# Patient Record
Sex: Female | Born: 1995 | Race: Black or African American | Hispanic: No | Marital: Single | Smoking: Never smoker
Health system: Southern US, Community
[De-identification: ages and names within clinical notes are randomized; demographics above are authoritative.]

## PROBLEM LIST (undated history)

## (undated) DIAGNOSIS — O24419 Gestational diabetes mellitus in pregnancy, unspecified control: Secondary | ICD-10-CM

## (undated) DIAGNOSIS — A64 Unspecified sexually transmitted disease: Secondary | ICD-10-CM

## (undated) HISTORY — PX: NO PAST SURGERIES: SHX2092

## (undated) HISTORY — DX: Gestational diabetes mellitus in pregnancy, unspecified control: O24.419

## (undated) HISTORY — DX: Unspecified sexually transmitted disease: A64

---
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
## 2013-02-09 ENCOUNTER — Ambulatory Visit (INDEPENDENT_AMBULATORY_CARE_PROVIDER_SITE_OTHER): Payer: BC Managed Care – PPO | Admitting: Obstetrics and Gynecology

## 2013-02-09 ENCOUNTER — Encounter: Payer: Self-pay | Admitting: Obstetrics and Gynecology

## 2013-02-09 VITALS — BP 118/60 | HR 82 | Ht 63.0 in | Wt 147.0 lb

## 2013-02-09 DIAGNOSIS — N912 Amenorrhea, unspecified: Secondary | ICD-10-CM

## 2013-02-09 DIAGNOSIS — D500 Iron deficiency anemia secondary to blood loss (chronic): Secondary | ICD-10-CM

## 2013-02-09 DIAGNOSIS — N920 Excessive and frequent menstruation with regular cycle: Secondary | ICD-10-CM

## 2013-02-09 DIAGNOSIS — B9689 Other specified bacterial agents as the cause of diseases classified elsewhere: Secondary | ICD-10-CM

## 2013-02-09 DIAGNOSIS — N760 Acute vaginitis: Secondary | ICD-10-CM

## 2013-02-09 DIAGNOSIS — N921 Excessive and frequent menstruation with irregular cycle: Secondary | ICD-10-CM

## 2013-02-09 DIAGNOSIS — A499 Bacterial infection, unspecified: Secondary | ICD-10-CM

## 2013-02-09 MED ORDER — METRONIDAZOLE 0.75 % VA GEL: 1.0000 | Freq: Every day | VAGINAL | Status: DC

## 2013-02-09 NOTE — Patient Instructions (Addendum)
Ethinyl Estradiol; Etonogestrel vaginal ring What is this medicine? ETHINYL ESTRADIOL; ETONOGESTREL (ETH in il es tra DYE ole; et oh noe JES trel) vaginal ring is a flexible, vaginal ring used as a contraceptive (birth control method). This medicine combines two types of female hormones, an estrogen and a progestin. This ring is used to prevent ovulation and pregnancy. Each ring is effective for one month. This medicine may be used for other purposes; ask your health care provider or pharmacist if you have questions. What should I tell my health care provider before I take this medicine? They need to know if you have or ever had any of these conditions: -abnormal vaginal bleeding -blood vessel disease or blood clots -breast, cervical, endometrial, ovarian, liver, or uterine cancer -diabetes -gallbladder disease -heart disease or recent heart attack -high blood pressure -high cholesterol -kidney disease -liver disease -migraine headaches -stroke -systemic lupus erythematosus (SLE) -tobacco smoker -an unusual or allergic reaction to estrogens, progestins, other medicines, foods, dyes, or preservatives -pregnant or trying to get pregnant -breast-feeding How should I use this medicine? Insert the ring into your vagina as directed. Follow the directions on the prescription label. The ring will remain place for 3 weeks and is then removed for a 1-week break. A new ring is inserted 1 week after the last ring was removed, on the same day of the week. Do not use more often than directed. A patient package insert for the product will be given with each prescription and refill. Read this sheet carefully each time. The sheet may change frequently. Contact your pediatrician regarding the use of this medicine in children. Special care may be needed. This medicine has been used in female children who have started having menstrual periods. Overdosage: If you think you have taken too much of this medicine  contact a poison control center or emergency room at once. NOTE: This medicine is only for you. Do not share this medicine with others. What if I miss a dose? You will need to replace your vaginal ring once a month as directed. If the ring should slip out, or if you leave it in longer or shorter than you should, contact your health care professional for advice. What may interact with this medicine? -acetaminophen -antibiotics or medicines for infections, especially rifampin, rifabutin, rifapentine, and griseofulvin, and possibly penicillins or tetracyclines -aprepitant -ascorbic acid (vitamin C) -atorvastatin -barbiturate medicines, such as phenobarbital -bosentan -carbamazepine -caffeine -clofibrate -cyclosporine -dantrolene -doxercalciferol -felbamate -grapefruit juice -hydrocortisone -medicines for anxiety or sleeping problems, such as diazepam or temazepam -medicines for diabetes, including pioglitazone -modafinil -mycophenolate -nefazodone -oxcarbazepine -phenytoin -prednisolone -ritonavir or other medicines for HIV infection or AIDS -rosuvastatin -selegiline -soy isoflavones supplements -St. John's wort -tamoxifen or raloxifene -theophylline -thyroid hormones -topiramate -warfarin This list may not describe all possible interactions. Give your health care provider a list of all the medicines, herbs, non-prescription drugs, or dietary supplements you use. Also tell them if you smoke, drink alcohol, or use illegal drugs. Some items may interact with your medicine. What should I watch for while using this medicine? Visit your doctor or health care professional for regular checks on your progress. You will need a regular breast and pelvic exam and Pap smear while on this medicine. Use an additional method of contraception during the first cycle that you use this ring. If you have any reason to think you are pregnant, stop using this medicine right away and contact your  doctor or health care professional. If you are using this   medicine for hormone related problems, it may take several cycles of use to see improvement in your condition. Smoking increases the risk of getting a blood clot or having a stroke while you are using hormonal birth control, especially if you are more than 19 years old. You are strongly advised not to smoke. This medicine can make your body retain fluid, making your fingers, hands, or ankles swell. Your blood pressure can go up. Contact your doctor or health care professional if you feel you are retaining fluid. This medicine can make you more sensitive to the sun. Keep out of the sun. If you cannot avoid being in the sun, wear protective clothing and use sunscreen. Do not use sun lamps or tanning beds/booths. If you wear contact lenses and notice visual changes, or if the lenses begin to feel uncomfortable, consult your eye care specialist. In some women, tenderness, swelling, or minor bleeding of the gums may occur. Notify your dentist if this happens. Brushing and flossing your teeth regularly may help limit this. See your dentist regularly and inform your dentist of the medicines you are taking. If you are going to have elective surgery, you may need to stop using this medicine before the surgery. Consult your health care professional for advice. This medicine does not protect you against HIV infection (AIDS) or any other sexually transmitted diseases. What side effects may I notice from receiving this medicine? Side effects that you should report to your doctor or health care professional as soon as possible: -breast tissue changes or discharge -changes in vaginal bleeding during your period or between your periods -chest pain -coughing up blood -dizziness or fainting spells -headaches or migraines -leg, arm or groin pain -severe or sudden headaches -stomach pain (severe) -sudden shortness of breath -sudden loss of coordination,  especially on one side of the body -speech problems -symptoms of vaginal infection like itching, irritation or unusual discharge -tenderness in the upper abdomen -vomiting -weakness or numbness in the arms or legs, especially on one side of the body -yellowing of the eyes or skin Side effects that usually do not require medical attention (report to your doctor or health care professional if they continue or are bothersome): -breakthrough bleeding and spotting that continues beyond the 3 initial cycles of pills -breast tenderness -mood changes, anxiety, depression, frustration, anger, or emotional outbursts -increased sensitivity to sun or ultraviolet light -nausea -skin rash, acne, or brown spots on the skin -weight gain (slight) This list may not describe all possible side effects. Call your doctor for medical advice about side effects. You may report side effects to FDA at 1-800-FDA-1088. Where should I keep my medicine? Keep out of the reach of children. Store at room temperature between 15 and 30 degrees C (59 and 86 degrees F) for up to 4 months. The product will expire after 4 months. Protect from light. Throw away any unused medicine after the expiration date. NOTE: This sheet is a summary. It may not cover all possible information. If you have questions about this medicine, talk to your doctor, pharmacist, or health care provider.  2013, [employer] Standard. (06/20/2008 12:03:58 PM)  Anemia, Frequently Asked Questions WHAT ARE THE SYMPTOMS OF ANEMIA?  Headache.  Difficulty thinking.  Fatigue.  Shortness of breath.  Weakness.  Rapid heartbeat. AT WHAT POINT ARE PEOPLE CONSIDERED ANEMIC?  This varies with gender and age.   Both hemoglobin (Hgb) and hematocrit values are used to define anemia. These lab values are obtained from a complete blood  count (CBC) test. This is performed at a caregiver's office.  The normal range of hemoglobin values for adult men is 14.0  g/dL to 11.9 g/dL. For nonpregnant women, values are 12.3 g/dL to 14.7 g/dL.  The World Health Organization defines anemia as less than 12 g/dL for nonpregnant women and less than 13 g/dL for men.  For adult males, the average normal hematocrit is 46%, and the range is 40% to 52%.  For adult females, the average normal hematocrit is 41%, and the range is 35% to 47%.  Values that fall below the lower limits can be a sign of anemia and should have further checking (evaluation). GROUPS OF PEOPLE WHO ARE AT RISK FOR DEVELOPING ANEMIA INCLUDE:   Infants who are breastfed or taking a formula that is not fortified with iron.  Children going through a rapid growth spurt. The iron available can not keep up with the needs for a red cell mass which must grow with the child.  Women in childbearing years. They need iron because of blood loss during menstruation.  Pregnant women. The growing fetus creates a high demand for iron.  People with ongoing gastrointestinal blood loss are at risk of developing iron deficiency.  Individuals with leukemia or cancer who must receive chemotherapy or radiation to treat their disease. The drugs or radiation used to treat these diseases often decreases the bone marrow's ability to make cells of all classes. This includes red blood cells, white blood cells, and platelets.  Individuals with chronic inflammatory conditions such as rheumatoid arthritis or chronic infections.  The elderly. ARE SOME TYPES OF ANEMIA INHERITED?   Yes, some types of anemia are due to inherited or genetic defects.  Sickle cell anemia. This occurs most often in people of African, African American, and Mediterranean descent.  Thalassemia (or Cooley's anemia). This type is found in people of Mediterranean and Southeast Asian descent. These types of anemia are common.  [place]. This is rare. CAN CERTAIN MEDICATIONS CAUSE A PERSON TO BECOME ANEMIC?  Yes. For example, drugs to fight cancer  (chemotherapeutic agents) often cause anemia. These drugs can slow the bone marrow's ability to make red blood cells. If there are not enough red blood cells, the body does not get enough oxygen. WHAT HEMATOCRIT LEVEL IS REQUIRED TO DONATE BLOOD?  The lower limit of an acceptable hematocrit for blood donors is 38%. If you have a low hematocrit value, you should schedule an appointment with your caregiver. ARE BLOOD TRANSFUSIONS COMMONLY USED TO CORRECT ANEMIA, AND ARE THEY DANGEROUS?  They are used to treat anemia as a last resort. Your caregiver will find the cause of the anemia and correct it if possible. Most blood transfusions are given because of excessive bleeding at the time of surgery, with trauma, or because of bone marrow suppression in patients with cancer or leukemia on chemotherapy. Blood transfusions are safer than ever before. We also know that blood transfusions affect the immune system and may increase certain risks. There is also a concern for human error. In 1/16,000 transfusions, a patient receives a transfusion of blood that is not matched with his or her blood type.  WHAT IS IRON DEFICIENCY ANEMIA AND CAN I CORRECT IT BY CHANGING MY DIET?  Iron is an essential part of hemoglobin. Without enough hemoglobin, anemia develops and the body does not get the right amount of oxygen. Iron deficiency anemia develops after the body has had a low level of iron for a long time. This is  either caused by blood loss, not taking in or absorbing enough iron, or increased demands for iron (like pregnancy or rapid growth).  Foods from animal origin such as beef, chicken, and pork, are good sources of iron. Be sure to have one of these foods at each meal. Vitamin C helps your body absorb iron. Foods rich in Vitamin C include citrus, bell pepper, strawberries, spinach and cantaloupe. In some cases, iron supplements may be needed in order to correct the iron deficiency. In the case of poor absorption, extra  iron may have to be given directly into the vein through a needle (intravenously). I HAVE BEEN DIAGNOSED WITH IRON DEFICIENCY ANEMIA AND MY CAREGIVER PRESCRIBED IRON SUPPLEMENTS. HOW LONG WILL IT TAKE FOR MY BLOOD TO BECOME NORMAL?  It depends on the degree of anemia at the beginning of treatment. Most people with mild to moderate iron deficiency, anemia will correct the anemia over a period of 2 to 3 months. But after the anemia is corrected, the iron stored by the body is still low. Caregivers often suggest an additional 6 months of oral iron therapy once the anemia has been reversed. This will help prevent the iron deficiency anemia from quickly happening again. Non-anemic adult males should take iron supplements only under the direction of a doctor, too much iron can cause liver damage.  MY HEMOGLOBIN IS 9 G/DL AND I AM SCHEDULED FOR SURGERY. SHOULD I POSTPONE THE SURGERY?  If you have Hgb of 9, you should discuss this with your caregiver right away. Many patients with similar hemoglobin levels have had surgery without problems. If minimal blood loss is expected for a minor procedure, no treatment may be necessary.  If a greater blood loss is expected for more extensive procedures, you should ask your caregiver about being treated with erythropoietin and iron. This is to accelerate the recovery of your hemoglobin to a normal level before surgery. An anemic patient who undergoes high-blood-loss surgery has a greater risk of surgical complications and need for a blood transfusion, which also carries some risk.  I HAVE BEEN TOLD THAT HEAVY MENSTRUAL PERIODS CAUSE ANEMIA. IS THERE ANYTHING I CAN DO TO PREVENT THE ANEMIA?  Anemia that results from heavy periods is usually due to iron deficiency. You can try to meet the increased demands for iron caused by the heavy monthly blood loss by increasing the intake of iron-rich foods. Iron supplements may be required. Discuss your concerns with your caregiver. WHAT  CAUSES ANEMIA DURING PREGNANCY?  Pregnancy places major demands on the body. The mother must meet the needs of both her body and her growing baby. The body needs enough iron and folate to make the right amount of red blood cells. To prevent anemia while pregnant, the mother should stay in close contact with her caregiver.  Be sure to eat a diet that has foods rich in iron and folate like liver and dark green leafy vegetables. Folate plays an important role in the normal development of a baby's spinal cord. Folate can help prevent serious disorders like spina bifida. If your diet does not provide adequate nutrients, you may want to talk with your caregiver about nutritional supplements.  WHAT IS THE RELATIONSHIP BETWEEN FIBROID TUMORS AND ANEMIA IN WOMEN?  The relationship is usually caused by the increased menstrual blood loss caused by fibroids. Good iron intake may be required to prevent iron deficiency anemia from developing.  Document Released: 02/11/2004 Document Revised: 09/27/2011 Document Reviewed: 07/28/2010 ExitCare Patient Information 2014 [place],  [place],  Please take a multivitamin with iron daily.  If you area already doing this, start irons sulfate 325 mg by mouth daily until we resolve your bleeding issues.   Conley Simmonds, MD

## 2013-02-09 NOTE — Progress Notes (Signed)
Patient ID: Sierra Wu, female   DOB: 01/15/1996, 17 y.o.   MRN: [id number]  17 y.o.   Single    African American   female   G0P0   here requesting removal of Implanon. Placed October 2012.    Having menses last for 3 -4 weeks at a time.  Can be heavy.  Not bleeding now.   Placed for pregnancy prevention and treatment of cramping.  Tired OCPs but could not remember to take them in the past (when she was 17 years old.)  Not interested in [place].  Worried about weight gain with contraception.    Just had STD testing. 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  States her partner is negative for HSV. Asking questions about prevention of infection for him.  Was treated for bacterial vaginosis in March.  Did not tolerate Flagyl due to the taste.   Has some heavy discharge, no burn, can have an odor.     Patient's last menstrual period was 12/12/2012.          Sexually active: yes  The current method of family planning is Implanon and condoms.      Hgb:  11.6        UPT:  Neg   History reviewed. No pertinent family history.  There are no active problems to display for this patient.   Past Medical History  Diagnosis Date  . STD (sexually transmitted disease) 08/2012    Dx'd with HSV II    History reviewed. No pertinent past surgical history.  Allergies: Review of patient's allergies indicates no known allergies.  Current Outpatient Prescriptions  Medication Sig Dispense Refill  . etonogestrel (IMPLANON) 68 MG IMPL implant Inject 1 each into the skin once.       No current facility-administered medications for this visit.    ROS: Pertinent items are noted in HPI.  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  Exam:    BP 118/60  Pulse 82  Ht 5\' 3"  (1.6 m)  Wt 147 lb (66.679 kg)  BMI 26.05 kg/m2  LMP 12/12/2012   Wt Readings from Last 3 Encounters:  02/09/13 147 lb (66.679 kg) (78%*, Z = 0.77)   * Growth percentiles are based on CDC  2-20 Years data.     Ht Readings from Last 3 Encounters:  02/09/13 5\' 3"  (1.6 m) (31%*, Z = -0.51)   * Growth percentiles are based on CDC 2-20 Years data.    General appearance: alert, cooperative and appears stated age Left arm - Implanon rod palpable by patient and clinician.  Pelvic: External genitalia:  no lesions              Urethra:  normal appearing urethra with no masses, tenderness or lesions              Bartholins and Skenes: normal                 Vagina: normal appearing vagina with normal color and discharge, no lesions              Cervix: normal appearance                     Bimanual Exam:  Uterus:  uterus is normal size, shape, consistency and nontender  Adnexa: normal adnexa in size, nontender and no masses                                         Wet prep - pH 5.5, positive for clue cells and whiff test.  Negative for trichomonas and yeast.  Assessment  Menometrorrhagia with Implanon. History of dysmenorrhea. UPT negative. Mild anemia. Bacterial vaginosis. History of HSV II.  Plan  I discussed with the etiology of the bleeding. We discussed how a course of estrogen therapy may correct the bleeding profile. We discussed alternative to Implanon such as OCPs, Ortho Evra, Mirena IUD, Depo Provera.  Patient is most interested in Nuva Ring at this time but wants to talk to her mother about it. Start a multivitamin with iron or add FeSO4 325 mg to daily regimen if already on a multivitamin. Metrogel pv at hs for 5 nights.  See Epic. We discussed female condom use for reduced risk of transmission of HSV to her partner.   An After Visit Summary was printed and given to the patient.

## 2013-10-31 ENCOUNTER — Encounter: Payer: Self-pay | Admitting: Obstetrics and Gynecology

## 2013-12-03 ENCOUNTER — Encounter: Payer: Self-pay | Admitting: Obstetrics and Gynecology

## 2017-04-25 DIAGNOSIS — N926 Irregular menstruation, unspecified: Secondary | ICD-10-CM | POA: Insufficient documentation

## 2017-08-03 ENCOUNTER — Other Ambulatory Visit: Payer: Self-pay

## 2017-08-03 ENCOUNTER — Ambulatory Visit (HOSPITAL_COMMUNITY)
Admission: EM | Admit: 2017-08-03 | Discharge: 2017-08-03 | Disposition: A | Discharge status: Home / Self Care | Payer: Managed Care, Other (non HMO) | Attending: Family Medicine | Admitting: Family Medicine

## 2017-08-03 ENCOUNTER — Encounter (HOSPITAL_COMMUNITY): Payer: Self-pay | Admitting: Emergency Medicine

## 2017-08-03 DIAGNOSIS — R110 Nausea: Secondary | ICD-10-CM

## 2017-08-03 DIAGNOSIS — Z3202 Encounter for pregnancy test, result negative: Secondary | ICD-10-CM

## 2017-08-03 DIAGNOSIS — R109 Unspecified abdominal pain: Secondary | ICD-10-CM

## 2017-08-03 DIAGNOSIS — K2900 Acute gastritis without bleeding: Secondary | ICD-10-CM | POA: Diagnosis not present

## 2017-08-03 LAB — POCT URINALYSIS DIP (DEVICE)
BILIRUBIN URINE: NEGATIVE
GLUCOSE, UA: NEGATIVE mg/dL
Hgb urine dipstick: NEGATIVE
KETONES UR: 15 mg/dL — AB
LEUKOCYTES UA: NEGATIVE
Nitrite: NEGATIVE
PH: 6 (ref 5.0–8.0)
Protein, ur: NEGATIVE mg/dL
Specific Gravity, Urine: >=1.03 (ref 1.005–1.030)
Urobilinogen, UA: 1 mg/dL (ref 0.0–1.0)

## 2017-08-03 LAB — POCT I-STAT, CHEM 8
BUN: 7 mg/dL (ref 6–20)
CALCIUM ION: 1.21 mmol/L (ref 1.15–1.40)
CHLORIDE: 106 mmol/L (ref 101–111)
Creatinine, Ser: 0.7 mg/dL (ref 0.44–1.00)
Glucose, Bld: 92 mg/dL (ref 65–99)
HEMATOCRIT: 38 % (ref 36.0–46.0)
Hemoglobin: 12.9 g/dL (ref 12.0–15.0)
Potassium: 3.8 mmol/L (ref 3.5–5.1)
SODIUM: 142 mmol/L (ref 135–145)
TCO2: 24 mmol/L (ref 22–32)

## 2017-08-03 LAB — POCT PREGNANCY, URINE: Preg Test, Ur: NEGATIVE

## 2017-08-03 MED ORDER — ONDANSETRON 8 MG PO TBDP: 8.0000 mg | ORAL_TABLET | Freq: Three times a day (TID) | ORAL | 0 refills | Status: DC | PRN

## 2017-08-03 MED ORDER — OMEPRAZOLE 20 MG PO CPDR: 20.0000 mg | DELAYED_RELEASE_CAPSULE | Freq: Every day | ORAL | 1 refills | Status: DC

## 2017-08-03 NOTE — ED Provider Notes (Signed)
[facility]   [id number] 08/03/17 Arrival Time: 1338   SUBJECTIVE:  Sierra Wu is a 22 y.o. female who presents to the urgent care with complaint of generalized sharp abdominal pain with nausea that started Monday.  Today she reports two hard black BM's and 1 episode of vomiting today that was yellow.  She took Pepto Bismol yesterday but it did not help  She's experiencing menstrual cramps.  Patient works at [employer]  Past Medical History:  Diagnosis Date  . STD (sexually transmitted disease) 08/2012   Dx'd with HSV II   History reviewed. No pertinent family history. Social History   Socioeconomic History  . Marital status: Single    Spouse name: Not on file  . Number of children: Not on file  . Years of education: Not on file  . Highest education level: Not on file  Social Needs  . Financial resource strain: Not on file  . Food insecurity - worry: Not on file  . Food insecurity - inability: Not on file  . Transportation needs - medical: Not on file  . Transportation needs - non-medical: Not on file  Occupational History  . Not on file  Tobacco Use  . Smoking status: Never Smoker  . Smokeless tobacco: Never Used  Substance and Sexual Activity  . Alcohol use: No  . Drug use: No  . Sexual activity: Yes    Birth control/protection: Other-see comments    Comment: Implanon inserted 04-23-2011  Other Topics Concern  . Not on file  Social History Narrative  . Not on file   Current Meds  Medication Sig  . norethindrone-ethinyl estradiol (JUNEL 1/20) 1-20 MG-MCG tablet Take 1 tablet by mouth daily.   No Known Allergies    ROS: As per HPI, remainder of ROS negative.   OBJECTIVE:   Vitals:   08/03/17 1418  BP: 110/75  Pulse: 80  Temp: 98.3 F (36.8 C)  TempSrc: Oral  SpO2: 96%     General appearance: alert; no distress Eyes: PERRL; EOMI; conjunctiva normal HENT: normocephalic; atraumatic; oral mucosa normal Neck: supple Lungs: clear to  auscultation bilaterally Heart: regular rate and rhythm Abdomen: soft, tender with deep palpation to epigastrium and periumbilical areas;  nontender right abdomen; bowel sounds normal; no masses or organomegaly; no guarding or rebound tenderness Back: no CVA tenderness Extremities: no cyanosis or edema; symmetrical with no gross deformities Skin: warm and dry Neurologic: normal gait; grossly normal Psychological: alert and cooperative; normal mood and affect      Labs:  Results for orders placed or performed during the hospital encounter of 08/03/17  POCT urinalysis dip (device)  Result Value Ref Range   Glucose, UA NEGATIVE NEGATIVE mg/dL   Bilirubin Urine NEGATIVE NEGATIVE   Ketones, ur 15 (A) NEGATIVE mg/dL   Specific Gravity, Urine >=1.030 1.005 - 1.030   Hgb urine dipstick NEGATIVE NEGATIVE   pH 6.0 5.0 - 8.0   Protein, ur NEGATIVE NEGATIVE mg/dL   Urobilinogen, UA 1.0 0.0 - 1.0 mg/dL   Nitrite NEGATIVE NEGATIVE   Leukocytes, UA NEGATIVE NEGATIVE  I-STAT, chem 8  Result Value Ref Range   Sodium 142 135 - 145 mmol/L   Potassium 3.8 3.5 - 5.1 mmol/L   Chloride 106 101 - 111 mmol/L   BUN 7 6 - 20 mg/dL   Creatinine, Ser 1.190.70 0.44 - 1.00 mg/dL   Glucose, Bld 92 65 - 99 mg/dL   Calcium, Ion 1.471.21 8.291.15 - 1.40 mmol/L   TCO2 24 22 -  32 mmol/L   Hemoglobin 12.9 12.0 - 15.0 g/dL   HCT 40.9 81.1 - 91.4 %  Pregnancy, urine POC  Result Value Ref Range   Preg Test, Ur NEGATIVE NEGATIVE    Labs Reviewed  POCT URINALYSIS DIP (DEVICE) - Abnormal; Notable for the following components:      Result Value   Ketones, ur 15 (*)    All other components within normal limits  POCT I-STAT, CHEM 8  POCT PREGNANCY, URINE    No results found.     ASSESSMENT & PLAN:  1. Acute superficial gastritis without hemorrhage     Meds ordered this encounter  Medications  . omeprazole (PRILOSEC) 20 MG capsule    Sig: Take 1 capsule (20 mg total) by mouth daily.    Dispense:  14 capsule      Refill:  1  . ondansetron (ZOFRAN-ODT) 8 MG disintegrating tablet    Sig: Take 1 tablet (8 mg total) by mouth every 8 (eight) hours as needed for nausea.    Dispense:  12 tablet    Refill:  0    Reviewed expectations re: course of current medical issues. Questions answered. Outlined signs and symptoms indicating need for more acute intervention. Patient verbalized understanding. After Visit Summary given.     Elvina Sidle, MD 08/03/17 1506

## 2017-08-03 NOTE — Discharge Instructions (Signed)
Expect symptoms to improve in 24-48 hours or follow up with your primary care provider or this clinic

## 2017-08-03 NOTE — ED Triage Notes (Signed)
Pt reports generalized sharp abdominal pain with nausea that started yesterday.  Today she reports two hard black BM's and 1 episode of vomiting today that was yello.

## 2018-03-07 ENCOUNTER — Encounter: Payer: Managed Care, Other (non HMO) | Admitting: Family Medicine

## 2018-04-05 ENCOUNTER — Encounter (HOSPITAL_COMMUNITY): Payer: Self-pay | Admitting: Emergency Medicine

## 2018-04-05 ENCOUNTER — Ambulatory Visit (HOSPITAL_COMMUNITY)
Admission: EM | Admit: 2018-04-05 | Discharge: 2018-04-05 | Disposition: A | Discharge status: Home / Self Care | Payer: 59 | Attending: Family Medicine | Admitting: Family Medicine

## 2018-04-05 DIAGNOSIS — J020 Streptococcal pharyngitis: Secondary | ICD-10-CM | POA: Diagnosis not present

## 2018-04-05 LAB — POCT RAPID STREP A: Streptococcus, Group A Screen (Direct): POSITIVE — AB

## 2018-04-05 MED ORDER — AMOXICILLIN 500 MG PO CAPS: 500.0000 mg | ORAL_CAPSULE | Freq: Two times a day (BID) | ORAL | 0 refills | Status: AC

## 2018-04-05 MED ORDER — LIDOCAINE VISCOUS HCL 2 % MT SOLN: OROMUCOSAL | 0 refills | Status: DC

## 2018-04-05 NOTE — ED Triage Notes (Signed)
Pt states she has a lump on one of her tonsils and it makes it hard to swallow, with some ear pain. Pt states "a lot is going on in my head, it feels heavy".

## 2018-04-05 NOTE — Discharge Instructions (Signed)
Rapid strep positive. Start amoxicillin as directed. Start lidocaine for sore throat, do not eat or drink for the next 40 mins after use as it can stunt your gag reflex.Tylenol/Motrin for fever and pain. Monitor for any worsening of symptoms, trouble breathing, trouble swallowing, swelling of the throat, leaning forward to breath, drooling, follow up here or at the emergency department for reevaluation.  For sore throat/cough try using a honey-based tea. Use 3 teaspoons of honey with juice squeezed from half lemon. Place shaved pieces of ginger into 1/2-1 cup of water and warm over stove top. Then mix the ingredients and repeat every 4 hours as needed.

## 2018-04-05 NOTE — ED Provider Notes (Signed)
[facility]    CSN: [id number] Arrival date & time: 04/05/18  1235     History   Chief Complaint Chief Complaint  Patient presents with  . Sore Throat    HPI Sierra Wu is a 22 y.o. female.   22 year old female comes in for 2-day history of URI symptoms.  Has had mild intermittent cough, nasal congestion without obvious rhinorrhea.  Denies fever, chills, night sweats.  Noticed swollen tonsils with painful swallowing.  Has tried to avoid solids the past few days due to the pain.  No sick contact.  Current every other day smoker, THC use. Has not taken anything for the symptoms.      Past Medical History:  Diagnosis Date  . 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   History reviewed. No pertinent surgical history.  OB History    Gravida  0   Para      Term      Preterm      AB      Living        SAB      TAB      Ectopic      Multiple      Live Births               Home Medications    Prior to Admission medications   Medication Sig Start Date End Date Taking? Authorizing Provider  amoxicillin (AMOXIL) 500 MG capsule Take 1 capsule (500 mg total) by mouth 2 (two) times daily for 10 days. 04/05/18 04/15/18  Belinda Fisher, PA-C  lidocaine (XYLOCAINE) 2 % solution 5-15 mL gurgle as needed 04/05/18   Cathie Hoops, Amy V, PA-C  norethindrone-ethinyl estradiol (JUNEL 1/20) 1-20 MG-MCG tablet Take 1 tablet by mouth daily.    [provider]  omeprazole (PRILOSEC) 20 MG capsule Take 1 capsule (20 mg total) by mouth daily. Patient not taking: Reported on 04/05/2018 08/03/17   Elvina Sidle, MD  ondansetron (ZOFRAN-ODT) 8 MG disintegrating tablet Take 1 tablet (8 mg total) by mouth every 8 (eight) hours as needed for nausea. Patient not taking: Reported on 04/05/2018 08/03/17   Elvina Sidle, MD    Family History Family History  Problem Relation Age of Onset    . Healthy Other     Social History Social History   Tobacco Use  . Smoking status: Never Smoker  . Smokeless tobacco: Never Used  Substance Use Topics  . Alcohol use: No  . Drug use: No     Allergies   Patient has no known allergies.   Review of Systems Review of Systems  Reason unable to perform ROS: See HPI as above.     Physical Exam Triage Vital Signs ED Triage Vitals  Enc Vitals Group     BP 04/05/18 1254 124/74     Pulse Rate 04/05/18 1254 87     Resp 04/05/18 1254 16     Temp 04/05/18 1254 98.5 F (36.9 C)     Temp src --      SpO2 04/05/18 1254 100 %     Weight --      Height --      Head Circumference --      Peak Flow --      Pain Score 04/05/18 1255 7     Pain Loc --      Pain Edu? --  Excl. in GC? --    No data found.  Updated Vital Signs BP 124/74   Pulse 87   Temp 98.5 F (36.9 C)   Resp 16   LMP 03/09/2018   SpO2 100%   Physical Exam  Constitutional: She is oriented to person, place, and time. She appears well-developed and well-nourished.  Non-toxic appearance. She does not appear ill. No distress.  HENT:  Head: Normocephalic and atraumatic.  Right Ear: Tympanic membrane, external ear and ear canal normal. Tympanic membrane is not erythematous and not bulging.  Left Ear: Tympanic membrane, external ear and ear canal normal. Tympanic membrane is not erythematous and not bulging.  Nose: Right sinus exhibits maxillary sinus tenderness and frontal sinus tenderness. Left sinus exhibits maxillary sinus tenderness and frontal sinus tenderness.  Mouth/Throat: Uvula is midline and mucous membranes are normal. Posterior oropharyngeal erythema present. Tonsils are 2+ on the right. Tonsils are 2+ on the left. No tonsillar exudate.  Eyes: Pupils are equal, round, and reactive to light. Conjunctivae are normal.  Neck: Normal range of motion. Neck supple.  Cardiovascular: Normal rate, regular rhythm and normal heart sounds. Exam reveals no  gallop and no friction rub.  No murmur heard. Pulmonary/Chest: Effort normal and breath sounds normal. She has no decreased breath sounds. She has no wheezes. She has no rhonchi. She has no rales.  Lymphadenopathy:    She has no cervical adenopathy.  Neurological: She is alert and oriented to person, place, and time.  Skin: Skin is warm and dry.  Psychiatric: She has a normal mood and affect. Her behavior is normal. Judgment normal.     UC Treatments / Results  Labs (all labs ordered are listed, but only abnormal results are displayed) Labs Reviewed  POCT RAPID STREP A - Abnormal; Notable for the following components:      Result Value   Streptococcus, Group A Screen (Direct) POSITIVE (*)    All other components within normal limits    EKG None  Radiology No results found.  Procedures Procedures (including critical care time)  Medications Ordered in UC Medications - No data to display  Initial Impression / Assessment and Plan / UC Course  I have reviewed the triage vital signs and the nursing notes.  Pertinent labs & imaging results that were available during my care of the patient were reviewed by me and considered in my medical decision making (see chart for details).    Rapid strep positive. Start antibiotic as directed. Symptomatic treatment as needed. Return precautions given.   Final Clinical Impressions(s) / UC Diagnoses   Final diagnoses:  Strep pharyngitis    ED Prescriptions    Medication Sig Dispense Auth. Provider   amoxicillin (AMOXIL) 500 MG capsule Take 1 capsule (500 mg total) by mouth 2 (two) times daily for 10 days. 20 capsule Yu, Amy V, PA-C   lidocaine (XYLOCAINE) 2 % solution 5-15 mL gurgle as needed 150 mL Threasa AlphaYu, Amy V, PA-C        Yu, Amy V, [place] 04/05/18 1439

## 2018-04-21 ENCOUNTER — Other Ambulatory Visit: Payer: Self-pay

## 2018-04-21 ENCOUNTER — Encounter (HOSPITAL_COMMUNITY): Payer: Self-pay | Admitting: Emergency Medicine

## 2018-04-21 ENCOUNTER — Ambulatory Visit (HOSPITAL_COMMUNITY)
Admission: EM | Admit: 2018-04-21 | Discharge: 2018-04-21 | Disposition: A | Discharge status: Home / Self Care | Payer: 59 | Attending: Family Medicine | Admitting: Family Medicine

## 2018-04-21 DIAGNOSIS — J029 Acute pharyngitis, unspecified: Secondary | ICD-10-CM | POA: Diagnosis not present

## 2018-04-21 DIAGNOSIS — N760 Acute vaginitis: Secondary | ICD-10-CM

## 2018-04-21 MED ORDER — PENICILLIN G BENZATHINE 1200000 UNIT/2ML IM SUSP
INTRAMUSCULAR | Status: AC
  Filled 2018-04-21: qty 2

## 2018-04-21 MED ORDER — PENICILLIN G BENZATHINE 1200000 UNIT/2ML IM SUSP
1.2000 10*6.[IU] | Freq: Once | INTRAMUSCULAR | Status: AC
  Administered 2018-04-21: 1.2 10*6.[IU] via INTRAMUSCULAR

## 2018-04-21 MED ORDER — FLUCONAZOLE 150 MG PO TABS: 150.0000 mg | ORAL_TABLET | Freq: Every day | ORAL | 0 refills | Status: DC

## 2018-04-21 NOTE — Discharge Instructions (Signed)
We will treat you for strep with a penicillin injection in clinic We are also prescribing Diflucan for yeast infection.  Take 1 tab today and 1 in 3 days if you are still having symptoms You may also benefit from some Zyrtec daily Follow up as needed for continued or worsening symptoms

## 2018-04-21 NOTE — ED Notes (Signed)
Bed: UC01 Expected date:  Expected time:  Means of arrival:  Comments: 

## 2018-04-21 NOTE — ED Provider Notes (Signed)
[facility]    CSN: [id number] Arrival date & time: 04/21/18  1135     History   Chief Complaint Chief Complaint  Patient presents with  . Sore Throat    Appt    HPI Sierra Wu is a 22 y.o. female.   Is a 22 year old female that presents with sore throat x2 days.  Symptoms have been constant and worsening overnight.  She was seen here on 04/05/2018 and diagnosed with strep throat and treated.  Reports she only took about half of the medication, went out of town for a couple of days and then came back and finished the rest of the antibiotics.  She is concerned that the strep did not get fully treated.  She did have relief of symptoms for about a week.  She also is suffering from a yeast infection from taking the antibiotics.  She is having labia swelling with erythema and vaginal itching.  She denies any abdominal pain, pelvic pain, back pain.  Denies any dysuria, hematuria.  she denies any associated cough, congestion, rhinorrhea, ear pain, fever, chills, body aches.  She is afebrile today but was afebrile previously when diagnosed with strep.  ROS per HPI      Past Medical History:  Diagnosis Date  . 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   History reviewed. No pertinent surgical history.  OB History    Gravida  0   Para      Term      Preterm      AB      Living        SAB      TAB      Ectopic      Multiple      Live Births               Home Medications    Prior to Admission medications   Medication Sig Start Date End Date Taking? Authorizing Provider  lidocaine (XYLOCAINE) 2 % solution 5-15 mL gurgle as needed 04/05/18  Yes Yu, Amy V, PA-C  norethindrone-ethinyl estradiol (JUNEL 1/20) 1-20 MG-MCG tablet Take 1 tablet by mouth daily.    [provider]  omeprazole (PRILOSEC) 20 MG capsule Take 1 capsule (20 mg total) by mouth  daily. Patient not taking: Reported on 04/05/2018 08/03/17   Elvina Sidle, MD  ondansetron (ZOFRAN-ODT) 8 MG disintegrating tablet Take 1 tablet (8 mg total) by mouth every 8 (eight) hours as needed for nausea. Patient not taking: Reported on 04/05/2018 08/03/17   Elvina Sidle, MD    Family History Family History  Problem Relation Age of Onset  . Healthy Other     Social History Social History   Tobacco Use  . Smoking status: Never Smoker  . Smokeless tobacco: Never Used  Substance Use Topics  . Alcohol use: No  . Drug use: No     Allergies   Patient has no known allergies.   Review of Systems Review of Systems   Physical Exam Triage Vital Signs ED Triage Vitals  Enc Vitals Group     BP 04/21/18 1152 118/76     Pulse Rate 04/21/18 1152 96     Resp --      Temp 04/21/18 1152 98.2 F (36.8 C)     Temp Source 04/21/18 1152 Oral     SpO2 --  Weight --      Height --      Head Circumference --      Peak Flow --      Pain Score 04/21/18 1151 7     Pain Loc --      Pain Edu? --      Excl. in GC? --    No data found.  Updated Vital Signs BP 118/76 (BP Location: Right Arm)   Pulse 96   Temp 98.2 F (36.8 C) (Oral)   LMP 04/08/2018 (Exact Date)   Visual Acuity Right Eye Distance:   Left Eye Distance:   Bilateral Distance:    Right Eye Near:   Left Eye Near:    Bilateral Near:     Physical Exam  Constitutional: She appears well-developed and well-nourished.  Very pleasant. Non toxic or ill appearing.     HENT:  Head: Normocephalic and atraumatic.  Mouth/Throat: Mucous membranes are normal. Tonsils are 2+ on the right. Tonsils are 2+ on the left. No tonsillar exudate.  Bilateral TMs normal.  External ears normal.  Mild posterior oropharyngeal erythema with 2+ tonsillar swelling.  No lymphadenopathy  Neck: Normal range of motion.  Cardiovascular: Normal rate and regular rhythm.  Pulmonary/Chest: Effort normal and breath sounds normal.   Lungs clear in all fields. No dyspnea or distress. No retractions or nasal flaring.   Abdominal: Soft. Bowel sounds are normal.  Neurological: She is alert.  Skin: Skin is warm and dry.  Psychiatric: She has a normal mood and affect.  Nursing note and vitals reviewed.    UC Treatments / Results  Labs (all labs ordered are listed, but only abnormal results are displayed) Labs Reviewed - No data to display  EKG None  Radiology No results found.  Procedures Procedures (including critical care time)  Medications Ordered in UC Medications  penicillin g benzathine (BICILLIN LA) 1200000 UNIT/2ML injection 1.2 Million Units (1.2 Million Units Intramuscular Given 04/21/18 1209)    Initial Impression / Assessment and Plan / UC Course  I have reviewed the triage vital signs and the nursing notes.  Pertinent labs & imaging results that were available during my care of the patient were reviewed by me and considered in my medical decision making (see chart for details).     Will go ahead and retreat strep with PCN here in the clinic.  Diflucan for yeast infection.  Follow up as needed for continued or worsening symptoms  Final Clinical Impressions(s) / UC Diagnoses   Final diagnoses:  Pharyngitis, unspecified etiology  Vaginitis and vulvovaginitis     Discharge Instructions     We will treat you for strep with a penicillin injection in clinic We are also prescribing Diflucan for yeast infection.  Take 1 tab today and 1 in 3 days if you are still having symptoms You may also benefit from some Zyrtec daily Follow up as needed for continued or worsening symptoms     ED Prescriptions    None     Controlled Substance Prescriptions  Controlled Substance Registry consulted? no   Janace Aris, NP 04/21/18 1237

## 2018-04-21 NOTE — ED Triage Notes (Signed)
Pt was seen and tested positive for Strep Throat on 04/05/18.  Pt admits that her last two doses were taken 4 days after they were supposed to be taken.  She states she got better but noticed last night that her throat was beginning to bother her again and this morning she reports a lot of pain with swallowing.  Pt also reports vaginal d/c and itching that started the day after taking her last dose of antibiotic.

## 2018-07-19 NOTE — L&D Delivery Note (Signed)
Delivery Note:  G1P0 at [redacted]w[redacted]d  Admitting diagnosis: Water Broke  Risks: None Augmentation: Pitocin ROM: SROM, 05/08/2019 @ 1130  Complete dilation at 05/09/2019  @ 1927 Onset of pushing at 1945 FHR second stage Cat 2 - Occ variables with ctx  Analgesia /Anesthesia intrapartum:Epidural  Pushing in lithotomy position RN, CNM, FOB present for birth and supportive  Delivery of a Live born female  Birth Weight: Pending APGAR: 6, 9  Newborn Delivery   Birth date/time: 05/09/2019 20:55:00 Delivery type: Vaginal, Spontaneous      in vertex presentation, position ROA to ROT.  Nuchal Cord: Yes x1 - Cord reduced by SNM during delivery. Cord double clamped after cessation of pulsation, cut by FOB.  Collection of cord blood for typing completed. Cord blood donation-Hyperspiraled  Arterial cord blood sample-Yes    Placenta delivered-Spontaneous  with 3 vessels . Uterotonics: IV Pitocin following delivery of placenta Placenta to L&D to be discarded. Uterine tone firm, bleeding light.  2nd degree  laceration identified. Repaired with 2-0 Vicryl in standard fashion. Episiotomy:None  Local analgesia: N/A - Epidural in place  Est. Blood Loss (mL): 665 mL Complications: None  APGAR:1 min-6 , 5 min-9  Mom to postpartum.  Baby to Couplet care / Skin to Skin.  Delivery Report:   Review the Delivery Report for details.     Signed: Juanna Cao, SNM, BSN 05/09/2019, 10:27 PM

## 2018-10-24 ENCOUNTER — Ambulatory Visit (INDEPENDENT_AMBULATORY_CARE_PROVIDER_SITE_OTHER): Payer: 59

## 2018-10-24 ENCOUNTER — Other Ambulatory Visit: Payer: Self-pay

## 2018-10-24 DIAGNOSIS — O0990 Supervision of high risk pregnancy, unspecified, unspecified trimester: Secondary | ICD-10-CM | POA: Insufficient documentation

## 2018-10-24 DIAGNOSIS — Z3490 Encounter for supervision of normal pregnancy, unspecified, unspecified trimester: Secondary | ICD-10-CM

## 2018-10-24 NOTE — Progress Notes (Signed)
I connected with  Sierra Wu on 10/24/18 at  8:15 AM EDT by telephone and verified that I am speaking with the correct person using two identifiers.     I discussed the limitations, risks, security and privacy concerns of performing an evaluation and management service by telephone and the availability of in person appointments. I also discussed with the patient that there may be a patient responsible charge related to this service. The patient expressed understanding and agreed to proceed.  Pt denies any bleeding or pain.  Pt reports LMP 08/13/18 with EDD 05/20/19.  Pt is 10w 2d today.  History obtained.  Informed pt that she will receive a pap smear and should think about genetic screening for her provider visit scheduled for 11/07/18.  Pt signed up for babyscripts and is successfully able to place BP value in app.  Pt has access to BP cuff at work however does not go weekly so I informed pt that we will give her a BP cuff and explain how to use it at her provider visit.  Pt verbalized understanding and with no further questions.  Ralene Bathe, RN 10/24/2018  8:37 AM

## 2018-10-24 NOTE — Progress Notes (Signed)
I have reviewed the chart and agree with nursing staff's documentation of this patient's encounter.  Vonzella Nipple, PA-C 10/24/2018 2:52 PM

## 2018-10-24 NOTE — Progress Notes (Signed)
.  cwht

## 2018-11-07 ENCOUNTER — Telehealth: Payer: Self-pay

## 2018-11-07 ENCOUNTER — Ambulatory Visit (INDEPENDENT_AMBULATORY_CARE_PROVIDER_SITE_OTHER): Payer: 59 | Admitting: Obstetrics and Gynecology

## 2018-11-07 ENCOUNTER — Encounter: Payer: Self-pay | Admitting: Obstetrics and Gynecology

## 2018-11-07 ENCOUNTER — Telehealth: Payer: Self-pay | Admitting: Student

## 2018-11-07 ENCOUNTER — Other Ambulatory Visit: Payer: Self-pay

## 2018-11-07 DIAGNOSIS — Z113 Encounter for screening for infections with a predominantly sexual mode of transmission: Secondary | ICD-10-CM | POA: Diagnosis not present

## 2018-11-07 DIAGNOSIS — B9689 Other specified bacterial agents as the cause of diseases classified elsewhere: Secondary | ICD-10-CM | POA: Diagnosis not present

## 2018-11-07 DIAGNOSIS — R8271 Bacteriuria: Secondary | ICD-10-CM

## 2018-11-07 DIAGNOSIS — Z3A12 12 weeks gestation of pregnancy: Secondary | ICD-10-CM

## 2018-11-07 DIAGNOSIS — N760 Acute vaginitis: Secondary | ICD-10-CM | POA: Diagnosis not present

## 2018-11-07 DIAGNOSIS — O98911 Unspecified maternal infectious and parasitic disease complicating pregnancy, first trimester: Secondary | ICD-10-CM

## 2018-11-07 DIAGNOSIS — Z3491 Encounter for supervision of normal pregnancy, unspecified, first trimester: Secondary | ICD-10-CM

## 2018-11-07 DIAGNOSIS — Z124 Encounter for screening for malignant neoplasm of cervix: Secondary | ICD-10-CM

## 2018-11-07 DIAGNOSIS — N898 Other specified noninflammatory disorders of vagina: Secondary | ICD-10-CM

## 2018-11-07 MED ORDER — VITAFOL GUMMIES 3.33-0.333-34.8 MG PO CHEW: 2.0000 | CHEWABLE_TABLET | Freq: Every day | ORAL | 6 refills | Status: AC

## 2018-11-07 NOTE — Progress Notes (Signed)
  Subjective:    Raylah Coronel is a G1P0 [redacted]w[redacted]d being seen today for her first obstetrical visit.  Her obstetrical history is significant for first pregnancy. Patient does intend to breast feed. Pregnancy history fully reviewed.  Patient reports no complaints.  Vitals:   11/07/18 0853  BP: 120/84  Pulse: 93  Temp: 98.6 F (37 C)    HISTORY: OB History  Gravida Para Term Preterm AB Living  1 0       0  SAB TAB Ectopic Multiple Live Births               # Outcome Date GA Lbr Len/2nd Weight Sex Delivery Anes PTL Lv  1 Current            Past Medical History:  Diagnosis Date  . 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 NO PAST SURGERIES     Family History  Problem Relation Age of Onset  . Healthy Other      Exam    Uterus:     Pelvic Exam:    Perineum: No Hemorrhoids   Vulva: normal   Vagina:  normal mucosa, normal discharge   pH:    Cervix: nulliparous appearance and cervix closed and long   Adnexa: no mass, fullness, tenderness   Bony Pelvis: gynecoid  System: Breast:  normal appearance, no masses or tenderness   Skin: normal coloration and turgor, no rashes    Neurologic: oriented, no focal deficits   Extremities: normal strength, tone, and muscle mass   HEENT extra ocular movement intact   Mouth/Teeth mucous membranes moist, pharynx normal without lesions and dental hygiene good   Neck supple and no masses   Cardiovascular: regular rate and rhythm   Respiratory:  appears well, vitals normal, no respiratory distress, acyanotic, normal RR, chest clear, no wheezing, crepitations, rhonchi, normal symmetric air entry   Abdomen: soft, non-tender; bowel sounds normal; no masses,  no organomegaly   Urinary:       Assessment:    Pregnancy: G1P0 Patient Active Problem List   Diagnosis Date Noted  . Supervision of low-risk pregnancy 10/24/2018        Plan:     Initial labs  drawn. Prenatal vitamins. Problem list reviewed and updated. Genetic Screening discussed : panorama  ordered.  Ultrasound discussed; fetal survey: requested.  Follow up in 4 weeks. Patient enrolled in [employer] on 4/7 but has bot received BP cuff. Company will be contacted Patient understands that her next visit will be a virtual visit 50% of 30 min visit spent on counseling and coordination of care.     Wilborn Membreno 11/07/2018

## 2018-11-07 NOTE — Patient Instructions (Signed)
° °First Trimester of Pregnancy °The first trimester of pregnancy is from week 1 until the end of week 13 (months 1 through 3). A week after a sperm fertilizes an egg, the egg will implant on the wall of the uterus. This embryo will begin to develop into a baby. Genes from you and your partner will form the baby. The female genes will determine whether the baby will be a boy or a girl. At 6-8 weeks, the eyes and face will be formed, and the heartbeat can be seen on ultrasound. At the end of 12 weeks, all the baby's organs will be formed. °Now that you are pregnant, you will want to do everything you can to have a healthy baby. Two of the most important things are to get good prenatal care and to follow your health care provider's instructions. Prenatal care is all the medical care you receive before the baby's birth. This care will help prevent, find, and treat any problems during the pregnancy and childbirth. °Body changes during your first trimester °Your body goes through many changes during pregnancy. The changes vary from woman to woman. °· You may gain or lose a couple of pounds at first. °· You may feel sick to your stomach (nauseous) and you may throw up (vomit). If the vomiting is uncontrollable, call your health care provider. °· You may tire easily. °· You may develop headaches that can be relieved by medicines. All medicines should be approved by your health care provider. °· You may urinate more often. Painful urination may mean you have a bladder infection. °· You may develop heartburn as a result of your pregnancy. °· You may develop constipation because certain hormones are causing the muscles that push stool through your intestines to slow down. °· You may develop hemorrhoids or swollen veins (varicose veins). °· Your breasts may begin to grow larger and become tender. Your nipples may stick out more, and the tissue that surrounds them (areola) may become darker. °· Your gums may bleed and may be  sensitive to brushing and flossing. °· Dark spots or blotches (chloasma, mask of pregnancy) may develop on your face. This will likely fade after the baby is born. °· Your menstrual periods will stop. °· You may have a loss of appetite. °· You may develop cravings for certain kinds of food. °· You may have changes in your emotions from day to day, such as being excited to be pregnant or being concerned that something may go wrong with the pregnancy and baby. °· You may have more vivid and strange dreams. °· You may have changes in your hair. These can include thickening of your hair, rapid growth, and changes in texture. Some women also have hair loss during or after pregnancy, or hair that feels dry or thin. Your hair will most likely return to normal after your baby is born. °What to expect at prenatal visits °During a routine prenatal visit: °· You will be weighed to make sure you and the baby are growing normally. °· Your blood pressure will be taken. °· Your abdomen will be measured to track your baby's growth. °· The fetal heartbeat will be listened to between weeks 10 and 14 of your pregnancy. °· Test results from any previous visits will be discussed. °Your health care provider may ask you: °· How you are feeling. °· If you are feeling the baby move. °· If you have had any abnormal symptoms, such as leaking fluid, bleeding, severe headaches, or   abdominal cramping. °· If you are using any tobacco products, including cigarettes, chewing tobacco, and electronic cigarettes. °· If you have any questions. °Other tests that may be performed during your first trimester include: °· Blood tests to find your blood type and to check for the presence of any previous infections. The tests will also be used to check for low iron levels (anemia) and protein on red blood cells (Rh antibodies). Depending on your risk factors, or if you previously had diabetes during pregnancy, you may have tests to check for high blood sugar  that affects pregnant women (gestational diabetes). °· Urine tests to check for infections, diabetes, or protein in the urine. °· An ultrasound to confirm the proper growth and development of the baby. °· Fetal screens for spinal cord problems (spina bifida) and Down syndrome. °· HIV (human immunodeficiency virus) testing. Routine prenatal testing includes screening for HIV, unless you choose not to have this test. °· You may need other tests to make sure you and the baby are doing well. °Follow these instructions at home: °Medicines °· Follow your health care provider's instructions regarding medicine use. Specific medicines may be either safe or unsafe to take during pregnancy. °· Take a prenatal vitamin that contains at least 600 micrograms (mcg) of folic acid. °· If you develop constipation, try taking a stool softener if your health care provider approves. °Eating and drinking ° °· Eat a balanced diet that includes fresh fruits and vegetables, whole grains, good sources of protein such as meat, eggs, or tofu, and low-fat dairy. Your health care provider will help you determine the amount of weight gain that is right for you. °· Avoid raw meat and uncooked cheese. These carry germs that can cause birth defects in the baby. °· Eating four or five small meals rather than three large meals a day may help relieve nausea and vomiting. If you start to feel nauseous, eating a few soda crackers can be helpful. Drinking liquids between meals, instead of during meals, also seems to help ease nausea and vomiting. °· Limit foods that are high in fat and processed sugars, such as fried and sweet foods. °· To prevent constipation: °? Eat foods that are high in fiber, such as fresh fruits and vegetables, whole grains, and beans. °? Drink enough fluid to keep your urine clear or pale yellow. °Activity °· Exercise only as directed by your health care provider. Most women can continue their usual exercise routine during  pregnancy. Try to exercise for 30 minutes at least 5 days a week. Exercising will help you: °? Control your weight. °? Stay in shape. °? Be prepared for labor and delivery. °· Experiencing pain or cramping in the lower abdomen or lower back is a good sign that you should stop exercising. Check with your health care provider before continuing with normal exercises. °· Try to avoid standing for long periods of time. Move your legs often if you must stand in one place for a long time. °· Avoid heavy lifting. °· Wear low-heeled shoes and practice good posture. °· You may continue to have sex unless your health care provider tells you not to. °Relieving pain and discomfort °· Wear a good support bra to relieve breast tenderness. °· Take warm sitz baths to soothe any pain or discomfort caused by hemorrhoids. Use hemorrhoid cream if your health care provider approves. °· Rest with your legs elevated if you have leg cramps or low back pain. °· If you develop varicose veins   in your legs, wear support hose. Elevate your feet for 15 minutes, 3-4 times a day. Limit salt in your diet. °Prenatal care °· Schedule your prenatal visits by the twelfth week of pregnancy. They are usually scheduled monthly at first, then more often in the last 2 months before delivery. °· Write down your questions. Take them to your prenatal visits. °· Keep all your prenatal visits as told by your health care provider. This is important. °Safety °· Wear your seat belt at all times when driving. °· Make a list of emergency phone numbers, including numbers for family, friends, the hospital, and police and fire departments. °General instructions °· Ask your health care provider for a referral to a local prenatal education class. Begin classes no later than the beginning of month 6 of your pregnancy. °· Ask for help if you have counseling or nutritional needs during pregnancy. Your health care provider can offer advice or refer you to specialists for help  with various needs. °· Do not use hot tubs, steam rooms, or saunas. °· Do not douche or use tampons or scented sanitary pads. °· Do not cross your legs for long periods of time. °· Avoid cat litter boxes and soil used by cats. These carry germs that can cause birth defects in the baby and possibly loss of the fetus by miscarriage or stillbirth. °· Avoid all smoking, herbs, alcohol, and medicines not prescribed by your health care provider. Chemicals in these products affect the formation and growth of the baby. °· Do not use any products that contain nicotine or tobacco, such as cigarettes and e-cigarettes. If you need help quitting, ask your health care provider. You may receive counseling support and other resources to help you quit. °· Schedule a dentist appointment. At home, brush your teeth with a soft toothbrush and be gentle when you floss. °Contact a health care provider if: °· You have dizziness. °· You have mild pelvic cramps, pelvic pressure, or nagging pain in the abdominal area. °· You have persistent nausea, vomiting, or diarrhea. °· You have a bad smelling vaginal discharge. °· You have pain when you urinate. °· You notice increased swelling in your face, hands, legs, or ankles. °· You are exposed to fifth disease or chickenpox. °· You are exposed to German measles (rubella) and have never had it. °Get help right away if: °· You have a fever. °· You are leaking fluid from your vagina. °· You have spotting or bleeding from your vagina. °· You have severe abdominal cramping or pain. °· You have rapid weight gain or loss. °· You vomit blood or material that looks like coffee grounds. °· You develop a severe headache. °· You have shortness of breath. °· You have any kind of trauma, such as from a fall or a car accident. °Summary °· The first trimester of pregnancy is from week 1 until the end of week 13 (months 1 through 3). °· Your body goes through many changes during pregnancy. The changes vary from  woman to woman. °· You will have routine prenatal visits. During those visits, your health care provider will examine you, discuss any test results you may have, and talk with you about how you are feeling. °This information is not intended to replace advice given to you by your health care provider. Make sure you discuss any questions you have with your health care provider. °Document Released: 06/29/2001 Document Revised: 06/16/2016 Document Reviewed: 06/16/2016 °[employer] Interactive Patient Education © 2019 [employer] ° ° °  Second Trimester of Pregnancy °The second trimester is from week 14 through week 27 (months 4 through 6). The second trimester is often a time when you feel your best. Your body has adjusted to being pregnant, and you begin to feel better physically. Usually, morning sickness has lessened or quit completely, you may have more energy, and you may have an increase in appetite. The second trimester is also a time when the fetus is growing rapidly. At the end of the sixth month, the fetus is about 9 inches long and weighs about 1½ pounds. You will likely begin to feel the baby move (quickening) between 16 and 20 weeks of pregnancy. °Body changes during your second trimester °Your body continues to go through many changes during your second trimester. The changes vary from woman to woman. °· Your weight will continue to increase. You will notice your lower abdomen bulging out. °· You may begin to get stretch marks on your hips, abdomen, and breasts. °· You may develop headaches that can be relieved by medicines. The medicines should be approved by your health care provider. °· You may urinate more often because the fetus is pressing on your bladder. °· You may develop or continue to have heartburn as a result of your pregnancy. °· You may develop constipation because certain hormones are causing the muscles that push waste through your intestines to slow down. °· You may develop hemorrhoids or  swollen, bulging veins (varicose veins). °· You may have back pain. This is caused by: °? Weight gain. °? Pregnancy hormones that are relaxing the joints in your pelvis. °? A shift in weight and the muscles that support your balance. °· Your breasts will continue to grow and they will continue to become tender. °· Your gums may bleed and may be sensitive to brushing and flossing. °· Dark spots or blotches (chloasma, mask of pregnancy) may develop on your face. This will likely fade after the baby is born. °· A dark line from your belly button to the pubic area (linea nigra) may appear. This will likely fade after the baby is born. °· You may have changes in your hair. These can include thickening of your hair, rapid growth, and changes in texture. Some women also have hair loss during or after pregnancy, or hair that feels dry or thin. Your hair will most likely return to normal after your baby is born. °What to expect at prenatal visits °During a routine prenatal visit: °· You will be weighed to make sure you and the fetus are growing normally. °· Your blood pressure will be taken. °· Your abdomen will be measured to track your baby's growth. °· The fetal heartbeat will be listened to. °· Any test results from the previous visit will be discussed. °Your health care provider may ask you: °· How you are feeling. °· If you are feeling the baby move. °· If you have had any abnormal symptoms, such as leaking fluid, bleeding, severe headaches, or abdominal cramping. °· If you are using any tobacco products, including cigarettes, chewing tobacco, and electronic cigarettes. °· If you have any questions. °Other tests that may be performed during your second trimester include: °· Blood tests that check for: °? Low iron levels (anemia). °? High blood sugar that affects pregnant women (gestational diabetes) between 24 and 28 weeks. °? Rh antibodies. This is to check for a protein on red blood cells (Rh factor). °· Urine tests  to check for infections, diabetes, or protein in   the urine. °· An ultrasound to confirm the proper growth and development of the baby. °· An amniocentesis to check for possible genetic problems. °· Fetal screens for spina bifida and Down syndrome. °· HIV (human immunodeficiency virus) testing. Routine prenatal testing includes screening for HIV, unless you choose not to have this test. °Follow these instructions at home: °Medicines °· Follow your health care provider's instructions regarding medicine use. Specific medicines may be either safe or unsafe to take during pregnancy. °· Take a prenatal vitamin that contains at least 600 micrograms (mcg) of folic acid. °· If you develop constipation, try taking a stool softener if your health care provider approves. °Eating and drinking ° °· Eat a balanced diet that includes fresh fruits and vegetables, whole grains, good sources of protein such as meat, eggs, or tofu, and low-fat dairy. Your health care provider will help you determine the amount of weight gain that is right for you. °· Avoid raw meat and uncooked cheese. These carry germs that can cause birth defects in the baby. °· If you have low calcium intake from food, talk to your health care provider about whether you should take a daily calcium supplement. °· Limit foods that are high in fat and processed sugars, such as fried and sweet foods. °· To prevent constipation: °? Drink enough fluid to keep your urine clear or pale yellow. °? Eat foods that are high in fiber, such as fresh fruits and vegetables, whole grains, and beans. °Activity °· Exercise only as directed by your health care provider. Most women can continue their usual exercise routine during pregnancy. Try to exercise for 30 minutes at least 5 days a week. Stop exercising if you experience uterine contractions. °· Avoid heavy lifting, wear low heel shoes, and practice good posture. °· A sexual relationship may be continued unless your health care  provider directs you otherwise. °Relieving pain and discomfort °· Wear a good support bra to prevent discomfort from breast tenderness. °· Take warm sitz baths to soothe any pain or discomfort caused by hemorrhoids. Use hemorrhoid cream if your health care provider approves. °· Rest with your legs elevated if you have leg cramps or low back pain. °· If you develop varicose veins, wear support hose. Elevate your feet for 15 minutes, 3-4 times a day. Limit salt in your diet. °Prenatal Care °· Write down your questions. Take them to your prenatal visits. °· Keep all your prenatal visits as told by your health care provider. This is important. °Safety °· Wear your seat belt at all times when driving. °· Make a list of emergency phone numbers, including numbers for family, friends, the hospital, and police and fire departments. °General instructions °· Ask your health care provider for a referral to a local prenatal education class. Begin classes no later than the beginning of month 6 of your pregnancy. °· Ask for help if you have counseling or nutritional needs during pregnancy. Your health care provider can offer advice or refer you to specialists for help with various needs. °· Do not use hot tubs, steam rooms, or saunas. °· Do not douche or use tampons or scented sanitary pads. °· Do not cross your legs for long periods of time. °· Avoid cat litter boxes and soil used by cats. These carry germs that can cause birth defects in the baby and possibly loss of the fetus by miscarriage or stillbirth. °· Avoid all smoking, herbs, alcohol, and unprescribed drugs. Chemicals in these products can affect the formation   and growth of the baby. °· Do not use any products that contain nicotine or tobacco, such as cigarettes and e-cigarettes. If you need help quitting, ask your health care provider. °· Visit your dentist if you have not gone yet during your pregnancy. Use a soft toothbrush to brush your teeth and be gentle when you  floss. °Contact a health care provider if: °· You have dizziness. °· You have mild pelvic cramps, pelvic pressure, or nagging pain in the abdominal area. °· You have persistent nausea, vomiting, or diarrhea. °· You have a bad smelling vaginal discharge. °· You have pain when you urinate. °Get help right away if: °· You have a fever. °· You are leaking fluid from your vagina. °· You have spotting or bleeding from your vagina. °· You have severe abdominal cramping or pain. °· You have rapid weight gain or weight loss. °· You have shortness of breath with chest pain. °· You notice sudden or extreme swelling of your face, hands, ankles, feet, or legs. °· You have not felt your baby move in over an hour. °· You have severe headaches that do not go away when you take medicine. °· You have vision changes. °Summary °· The second trimester is from week 14 through week 27 (months 4 through 6). It is also a time when the fetus is growing rapidly. °· Your body goes through many changes during pregnancy. The changes vary from woman to woman. °· Avoid all smoking, herbs, alcohol, and unprescribed drugs. These chemicals affect the formation and growth your baby. °· Do not use any tobacco products, such as cigarettes, chewing tobacco, and e-cigarettes. If you need help quitting, ask your health care provider. °· Contact your health care provider if you have any questions. Keep all prenatal visits as told by your health care provider. This is important. °This information is not intended to replace advice given to you by your health care provider. Make sure you discuss any questions you have with your health care provider. °Document Released: 06/29/2001 Document Revised: 08/10/2016 Document Reviewed: 08/10/2016 °[employer] Interactive Patient Education © 2019 [employer] ° ° °Contraception Choices °Contraception, also called birth control, refers to methods or devices that prevent pregnancy. °Hormonal methods °Contraceptive  implant ° °A contraceptive implant is a thin, plastic tube that contains a hormone. It is inserted into the upper part of the arm. It can remain in place for up to 3 years. °Progestin-only injections °Progestin-only injections are injections of progestin, a synthetic form of the hormone progesterone. They are given every 3 months by a health care provider. °Birth control pills ° °Birth control pills are pills that contain hormones that prevent pregnancy. They must be taken once a day, preferably at the same time each day. °Birth control patch ° °The birth control patch contains hormones that prevent pregnancy. It is placed on the skin and must be changed once a week for three weeks and removed on the fourth week. A prescription is needed to use this method of contraception. °Vaginal ring ° °A vaginal ring contains hormones that prevent pregnancy. It is placed in the vagina for three weeks and removed on the fourth week. After that, the process is repeated with a new ring. A prescription is needed to use this method of contraception. °Emergency contraceptive °Emergency contraceptives prevent pregnancy after unprotected sex. They come in pill form and can be taken up to 5 days after sex. They work best the sooner they are taken after having sex. Most emergency   contraceptives are available without a prescription. This method should not be used as your only form of birth control. °Barrier methods °Female condom ° °A female condom is a thin sheath that is worn over the penis during sex. Condoms keep sperm from going inside a woman's body. They can be used with a spermicide to increase their effectiveness. They should be disposed after a single use. °Female condom ° °A female condom is a soft, loose-fitting sheath that is put into the vagina before sex. The condom keeps sperm from going inside a woman's body. They should be disposed after a single use. °Diaphragm ° °A diaphragm is a soft, dome-shaped barrier. It is inserted  into the vagina before sex, along with a spermicide. The diaphragm blocks sperm from entering the uterus, and the spermicide kills sperm. A diaphragm should be left in the vagina for 6-8 hours after sex and removed within 24 hours. °A diaphragm is prescribed and fitted by a health care provider. A diaphragm should be replaced every 1-2 years, after giving birth, after gaining more than 15 lb (6.8 kg), and after pelvic surgery. °Cervical cap ° °A cervical cap is a round, soft latex or plastic cup that fits over the cervix. It is inserted into the vagina before sex, along with spermicide. It blocks sperm from entering the uterus. The cap should be left in place for 6-8 hours after sex and removed within 48 hours. A cervical cap must be prescribed and fitted by a health care provider. It should be replaced every 2 years. °Sponge ° °A sponge is a soft, circular piece of polyurethane foam with spermicide on it. The sponge helps block sperm from entering the uterus, and the spermicide kills sperm. To use it, you make it wet and then insert it into the vagina. It should be inserted before sex, left in for at least 6 hours after sex, and removed and thrown away within 30 hours. °Spermicides °Spermicides are chemicals that kill or block sperm from entering the cervix and uterus. They can come as a cream, jelly, suppository, foam, or tablet. A spermicide should be inserted into the vagina with an applicator at least 10-15 minutes before sex to allow time for it to work. The process must be repeated every time you have sex. Spermicides do not require a prescription. °Intrauterine contraception °Intrauterine device (IUD) °An IUD is a T-shaped device that is put in a woman's uterus. There are two types: °· Hormone IUD.This type contains progestin, a synthetic form of the hormone progesterone. This type can stay in place for 3-5 years. °· Copper IUD.This type is wrapped in copper wire. It can stay in place for 10  years. ° °Permanent methods of contraception °Female tubal ligation °In this method, a woman's fallopian tubes are sealed, tied, or blocked during surgery to prevent eggs from traveling to the uterus. °Hysteroscopic sterilization °In this method, a small, flexible insert is placed into each fallopian tube. The inserts cause scar tissue to form in the fallopian tubes and block them, so sperm cannot reach an egg. The procedure takes about 3 months to be effective. Another form of birth control must be used during those 3 months. °Female sterilization °This is a procedure to tie off the tubes that carry sperm (vasectomy). After the procedure, the man can still ejaculate fluid (semen). °Natural planning methods °Natural family planning °In this method, a couple does not have sex on days when the woman could become pregnant. °Calendar method °This means keeping track   of the length of each menstrual cycle, identifying the days when pregnancy can happen, and not having sex on those days. °Ovulation method °In this method, a couple avoids sex during ovulation. °Symptothermal method °This method involves not having sex during ovulation. The woman typically checks for ovulation by watching changes in her temperature and in the consistency of cervical mucus. °Post-ovulation method °In this method, a couple waits to have sex until after ovulation. °Summary °· Contraception, also called birth control, means methods or devices that prevent pregnancy. °· Hormonal methods of contraception include implants, injections, pills, patches, vaginal rings, and emergency contraceptives. °· Barrier methods of contraception can include female condoms, female condoms, diaphragms, cervical caps, sponges, and spermicides. °· There are two types of IUDs (intrauterine devices). An IUD can be put in a woman's uterus to prevent pregnancy for 3-5 years. °· Permanent sterilization can be done through a procedure for males, females, or both. °· Natural  family planning methods involve not having sex on days when the woman could become pregnant. °This information is not intended to replace advice given to you by your health care provider. Make sure you discuss any questions you have with your health care provider. °Document Released: 07/05/2005 Document Revised: 07/07/2017 Document Reviewed: 08/07/2016 °Elsevier Interactive Patient Education © 2019 Elsevier Inc. ° ° °Breastfeeding ° °Choosing to breastfeed is one of the best decisions you can make for yourself and your baby. A change in hormones during pregnancy causes your breasts to make breast milk in your milk-producing glands. Hormones prevent breast milk from being released before your baby is born. They also prompt milk flow after birth. Once breastfeeding has begun, thoughts of your baby, as well as his or her sucking or crying, can stimulate the release of milk from your milk-producing glands. °Benefits of breastfeeding °Research shows that breastfeeding offers many health benefits for infants and mothers. It also offers a cost-free and convenient way to feed your baby. °For your baby °· Your first milk (colostrum) helps your baby's digestive system to function better. °· Special cells in your milk (antibodies) help your baby to fight off infections. °· Breastfed babies are less likely to develop asthma, allergies, obesity, or type 2 diabetes. They are also at lower risk for sudden infant death syndrome (SIDS). °· Nutrients in breast milk are better able to meet your baby’s needs compared to infant formula. °· Breast milk improves your baby's brain development. °For you °· Breastfeeding helps to create a very special bond between you and your baby. °· Breastfeeding is convenient. Breast milk costs nothing and is always available at the correct temperature. °· Breastfeeding helps to burn calories. It helps you to lose the weight that you gained during pregnancy. °· Breastfeeding makes your uterus return  faster to its size before pregnancy. It also slows bleeding (lochia) after you give birth. °· Breastfeeding helps to lower your risk of developing type 2 diabetes, osteoporosis, rheumatoid arthritis, cardiovascular disease, and breast, ovarian, uterine, and endometrial cancer later in life. °Breastfeeding basics °Starting breastfeeding °· Find a comfortable place to sit or lie down, with your neck and back well-supported. °· Place a pillow or a rolled-up blanket under your baby to bring him or her to the level of your breast (if you are seated). Nursing pillows are specially designed to help support your arms and your baby while you breastfeed. °· Make sure that your baby's tummy (abdomen) is facing your abdomen. °· Gently massage your breast. With your fingertips, massage from the   outer edges of your breast inward toward the nipple. This encourages milk flow. If your milk flows slowly, you may need to continue this action during the feeding. °· Support your breast with 4 fingers underneath and your thumb above your nipple (make the letter "C" with your hand). Make sure your fingers are well away from your nipple and your baby’s mouth. °· Stroke your baby's lips gently with your finger or nipple. °· When your baby's mouth is open wide enough, quickly bring your baby to your breast, placing your entire nipple and as much of the areola as possible into your baby's mouth. The areola is the colored area around your nipple. °? More areola should be visible above your baby's upper lip than below the lower lip. °? Your baby's lips should be opened and extended outward (flanged) to ensure an adequate, comfortable latch. °? Your baby's tongue should be between his or her lower gum and your breast. °· Make sure that your baby's mouth is correctly positioned around your nipple (latched). Your baby's lips should create a seal on your breast and be turned out (everted). °· It is common for your baby to suck about 2-3 minutes in  order to start the flow of breast milk. °Latching °Teaching your baby how to latch onto your breast properly is very important. An improper latch can cause nipple pain, decreased milk supply, and poor weight gain in your baby. Also, if your baby is not latched onto your nipple properly, he or she may swallow some air during feeding. This can make your baby fussy. Burping your baby when you switch breasts during the feeding can help to get rid of the air. However, teaching your baby to latch on properly is still the best way to prevent fussiness from swallowing air while breastfeeding. °Signs that your baby has successfully latched onto your nipple °· Silent tugging or silent sucking, without causing you pain. Infant's lips should be extended outward (flanged). °· Swallowing heard between every 3-4 sucks once your milk has started to flow (after your let-down milk reflex occurs). °· Muscle movement above and in front of his or her ears while sucking. °Signs that your baby has not successfully latched onto your nipple °· Sucking sounds or smacking sounds from your baby while breastfeeding. °· Nipple pain. °If you think your baby has not latched on correctly, slip your finger into the corner of your baby’s mouth to break the suction and place it between your baby's gums. Attempt to start breastfeeding again. °Signs of successful breastfeeding °Signs from your baby °· Your baby will gradually decrease the number of sucks or will completely stop sucking. °· Your baby will fall asleep. °· Your baby's body will relax. °· Your baby will retain a small amount of milk in his or her mouth. °· Your baby will let go of your breast by himself or herself. °Signs from you °· Breasts that have increased in firmness, weight, and size 1-3 hours after feeding. °· Breasts that are softer immediately after breastfeeding. °· Increased milk volume, as well as a change in milk consistency and color by the fifth day of  breastfeeding. °· Nipples that are not sore, cracked, or bleeding. °Signs that your baby is getting enough milk °· Wetting at least 1-2 diapers during the first 24 hours after birth. °· Wetting at least 5-6 diapers every 24 hours for the first week after birth. The urine should be clear or pale yellow by the age of 5 days. °·   Wetting 6-8 diapers every 24 hours as your baby continues to grow and develop. °· At least 3 stools in a 24-hour period by the age of 5 days. The stool should be soft and yellow. °· At least 3 stools in a 24-hour period by the age of 7 days. The stool should be seedy and yellow. °· No loss of weight greater than 10% of birth weight during the first 3 days of life. °· Average weight gain of 4-7 oz (113-198 g) per week after the age of 4 days. °· Consistent daily weight gain by the age of 5 days, without weight loss after the age of 2 weeks. °After a feeding, your baby may spit up a small amount of milk. This is normal. °Breastfeeding frequency and duration °Frequent feeding will help you make more milk and can prevent sore nipples and extremely full breasts (breast engorgement). Breastfeed when you feel the need to reduce the fullness of your breasts or when your baby shows signs of hunger. This is called "breastfeeding on demand." Signs that your baby is hungry include: °· Increased alertness, activity, or restlessness. °· Movement of the head from side to side. °· Opening of the mouth when the corner of the mouth or cheek is stroked (rooting). °· Increased sucking sounds, smacking lips, cooing, sighing, or squeaking. °· Hand-to-mouth movements and sucking on fingers or hands. °· Fussing or crying. °Avoid introducing a pacifier to your baby in the first 4-6 weeks after your baby is born. After this time, you may choose to use a pacifier. Research has shown that pacifier use during the first year of a baby's life decreases the risk of sudden infant death syndrome (SIDS). °Allow your baby to feed  on each breast as long as he or she wants. When your baby unlatches or falls asleep while feeding from the first breast, offer the second breast. Because newborns are often sleepy in the first few weeks of life, you may need to awaken your baby to get him or her to feed. °Breastfeeding times will vary from baby to baby. However, the following rules can serve as a guide to help you make sure that your baby is properly fed: °· Newborns (babies 4 weeks of age or younger) may breastfeed every 1-3 hours. °· Newborns should not go without breastfeeding for longer than 3 hours during the day or 5 hours during the night. °· You should breastfeed your baby a minimum of 8 times in a 24-hour period. °Breast milk pumping ° °  ° °Pumping and storing breast milk allows you to make sure that your baby is exclusively fed your breast milk, even at times when you are unable to breastfeed. This is especially important if you go back to work while you are still breastfeeding, or if you are not able to be present during feedings. Your lactation consultant can help you find a method of pumping that works best for you and give you guidelines about how long it is safe to store breast milk. °Caring for your breasts while you breastfeed °Nipples can become dry, cracked, and sore while breastfeeding. The following recommendations can help keep your breasts moisturized and healthy: °· Avoid using soap on your nipples. °· Wear a supportive bra designed especially for nursing. Avoid wearing underwire-style bras or extremely tight bras (sports bras). °· Air-dry your nipples for 3-4 minutes after each feeding. °· Use only cotton bra pads to absorb leaked breast milk. Leaking of breast milk between feedings is normal. °·   Use lanolin on your nipples after breastfeeding. Lanolin helps to maintain your skin's normal moisture barrier. Pure lanolin is not harmful (not toxic) to your baby. You may also hand express a few drops of breast milk and gently  massage that milk into your nipples and allow the milk to air-dry. °In the first few weeks after giving birth, some women experience breast engorgement. Engorgement can make your breasts feel heavy, warm, and tender to the touch. Engorgement peaks within 3-5 days after you give birth. The following recommendations can help to ease engorgement: °· Completely empty your breasts while breastfeeding or pumping. You may want to start by applying warm, moist heat (in the shower or with warm, water-soaked hand towels) just before feeding or pumping. This increases circulation and helps the milk flow. If your baby does not completely empty your breasts while breastfeeding, pump any extra milk after he or she is finished. °· Apply ice packs to your breasts immediately after breastfeeding or pumping, unless this is too uncomfortable for you. To do this: °? Put ice in a plastic bag. °? Place a towel between your skin and the bag. °? Leave the ice on for 20 minutes, 2-3 times a day. °· Make sure that your baby is latched on and positioned properly while breastfeeding. °If engorgement persists after 48 hours of following these recommendations, contact your health care provider or a lactation consultant. °Overall health care recommendations while breastfeeding °· Eat 3 healthy meals and 3 snacks every day. Well-nourished mothers who are breastfeeding need an additional 450-500 calories a day. You can meet this requirement by increasing the amount of a balanced diet that you eat. °· Drink enough water to keep your urine pale yellow or clear. °· Rest often, relax, and continue to take your prenatal vitamins to prevent fatigue, stress, and low vitamin and mineral levels in your body (nutrient deficiencies). °· Do not use any products that contain nicotine or tobacco, such as cigarettes and e-cigarettes. Your baby may be harmed by chemicals from cigarettes that pass into breast milk and exposure to secondhand smoke. If you need help  quitting, ask your health care provider. °· Avoid alcohol. °· Do not use illegal drugs or marijuana. °· Talk with your health care provider before taking any medicines. These include over-the-counter and prescription medicines as well as vitamins and herbal supplements. Some medicines that may be harmful to your baby can pass through breast milk. °· It is possible to become pregnant while breastfeeding. If birth control is desired, ask your health care provider about options that will be safe while breastfeeding your baby. °Where to find more information: °[employer]: www.llli.org °Contact a health care provider if: °· You feel like you want to stop breastfeeding or have become frustrated with breastfeeding. °· Your nipples are cracked or bleeding. °· Your breasts are red, tender, or warm. °· You have: °? Painful breasts or nipples. °? A swollen area on either breast. °? A fever or chills. °? Nausea or vomiting. °? Drainage other than breast milk from your nipples. °· Your breasts do not become full before feedings by the fifth day after you give birth. °· You feel sad and depressed. °· Your baby is: °? Too sleepy to eat well. °? Having trouble sleeping. °? More than 1 week old and wetting fewer than 6 diapers in a 24-hour period. °? Not gaining weight by 5 days of age. °· Your baby has fewer than 3 stools in a 24-hour period. °·   Your baby's skin or the white parts of his or her eyes become yellow. °Get help right away if: °· Your baby is overly tired (lethargic) and does not want to wake up and feed. °· Your baby develops an unexplained fever. °Summary °· Breastfeeding offers many health benefits for infant and mothers. °· Try to breastfeed your infant when he or she shows early signs of hunger. °· Gently tickle or stroke your baby's lips with your finger or nipple to allow the baby to open his or her mouth. Bring the baby to your breast. Make sure that much of the areola is in your baby's mouth.  Offer one side and burp the baby before you offer the other side. °· Talk with your health care provider or lactation consultant if you have questions or you face problems as you breastfeed. °This information is not intended to replace advice given to you by your health care provider. Make sure you discuss any questions you have with your health care provider. °Document Released: 07/05/2005 Document Revised: 08/06/2016 Document Reviewed: 08/06/2016 °Elsevier Interactive Patient Education © 2019 Elsevier Inc. ° ° °

## 2018-11-07 NOTE — Telephone Encounter (Signed)
LM for pt that that she has an appt scheduled for tomorrow that we need to cancel.  If she could please give the office a call.

## 2018-11-07 NOTE — Telephone Encounter (Signed)
Called the patient to schedule an appointment for the BP cuff pick up and education on how to use. The patient agreed to the upcoming date and time.

## 2018-11-08 ENCOUNTER — Telehealth: Payer: Self-pay | Admitting: Obstetrics and Gynecology

## 2018-11-08 ENCOUNTER — Ambulatory Visit: Payer: 59

## 2018-11-08 LAB — CYTOLOGY - PAP
Chlamydia: NEGATIVE
Diagnosis: NEGATIVE
Neisseria Gonorrhea: NEGATIVE
Trichomonas: NEGATIVE

## 2018-11-08 NOTE — Telephone Encounter (Signed)
The patient called in stating she has to rescheduled her appointment because she cant get off from work. Stated she would rather have a day next week.

## 2018-11-09 LAB — CERVICOVAGINAL ANCILLARY ONLY
Bacterial vaginitis: POSITIVE — AB
Candida vaginitis: NEGATIVE

## 2018-11-09 MED ORDER — METRONIDAZOLE 500 MG PO TABS: 500.0000 mg | ORAL_TABLET | Freq: Two times a day (BID) | ORAL | 0 refills | Status: DC

## 2018-11-09 NOTE — Addendum Note (Signed)
Addended by: Catalina Antigua on: 11/09/2018 11:51 PM   Modules accepted: Orders

## 2018-11-10 ENCOUNTER — Telehealth (INDEPENDENT_AMBULATORY_CARE_PROVIDER_SITE_OTHER): Payer: 59 | Admitting: *Deleted

## 2018-11-10 DIAGNOSIS — A499 Bacterial infection, unspecified: Secondary | ICD-10-CM

## 2018-11-10 NOTE — Telephone Encounter (Signed)
Called pt to inform her that she has BV and that flagyl was sent to the [facility] on [address].  Pt verbalized understanding.

## 2018-11-10 NOTE — Telephone Encounter (Signed)
-----   Message from Catalina Antigua, MD sent at 11/09/2018 11:51 PM EDT ----- Please inform patient of BV infection. Rx flagyl has been e-prescribed to her [facility] pharmacy on [address]

## 2018-11-11 LAB — URINE CULTURE, OB REFLEX

## 2018-11-11 LAB — CULTURE, OB URINE

## 2018-11-11 LAB — OB RESULTS CONSOLE GBS: GBS: POSITIVE

## 2018-11-13 ENCOUNTER — Telehealth: Payer: Self-pay | Admitting: Advanced Practice Midwife

## 2018-11-13 ENCOUNTER — Other Ambulatory Visit: Payer: Self-pay

## 2018-11-13 ENCOUNTER — Ambulatory Visit (INDEPENDENT_AMBULATORY_CARE_PROVIDER_SITE_OTHER): Payer: 59

## 2018-11-13 ENCOUNTER — Telehealth: Payer: Self-pay

## 2018-11-13 DIAGNOSIS — R8271 Bacteriuria: Secondary | ICD-10-CM | POA: Insufficient documentation

## 2018-11-13 DIAGNOSIS — Z719 Counseling, unspecified: Secondary | ICD-10-CM

## 2018-11-13 MED ORDER — METRONIDAZOLE 500 MG PO TABS: 500.0000 mg | ORAL_TABLET | Freq: Two times a day (BID) | ORAL | 0 refills | Status: DC

## 2018-11-13 NOTE — Progress Notes (Signed)
Pt here today for BP cuff.  Informed pt that she should receive a BP cuff from [facility].  Called [employer] support and spoke with Irving Burton who informed me that the pt was placed into their system as that the office would be giving the pt a BP cuff.  I requested to have pt sent a [employer] cuff.  Irving Burton that she would get it out to the pt on today's shipment.  Pt notified that a cuff would be sent to her home and if she does not receive it on Friday to please give the office a call.  Pt verbalized understanding.

## 2018-11-13 NOTE — Addendum Note (Signed)
Addended by: Catalina Antigua on: 11/13/2018 08:34 AM   Modules accepted: Orders

## 2018-11-13 NOTE — Telephone Encounter (Signed)
Called the patient to inform of the upcoming virtual visit per nurse Addison Naegeli). Left a detailed voicemail.

## 2018-11-13 NOTE — Progress Notes (Signed)
I have reviewed the chart and agree with nursing staff's documentation of this patient's encounter.  Thressa Sheller DNP, CNM  11/13/18  4:46 PM

## 2018-11-13 NOTE — Telephone Encounter (Addendum)
-----   Message from Catalina Antigua, MD sent at 11/13/2018  8:33 AM EDT ----- Please inform patient of BV infection. Rx has been e-prescribed  LM that I am calling with results and an appt if she could please call the office to request to speak with a nurse.

## 2018-11-14 NOTE — Telephone Encounter (Signed)
Called pt to inform the that she has BV and a medication was sent to her pharmacy.  Pt did not pick up.  Left message advising pt that she was being contacted regarding results and requesting that she contact the office.  Also advised pt that a letter will be sent.  Letter sent.

## 2018-11-14 NOTE — Telephone Encounter (Signed)
Opened in error

## 2018-11-15 ENCOUNTER — Telehealth (INDEPENDENT_AMBULATORY_CARE_PROVIDER_SITE_OTHER): Payer: 59 | Admitting: Family Medicine

## 2018-11-15 DIAGNOSIS — N760 Acute vaginitis: Secondary | ICD-10-CM

## 2018-11-15 DIAGNOSIS — B9689 Other specified bacterial agents as the cause of diseases classified elsewhere: Secondary | ICD-10-CM

## 2018-11-15 MED ORDER — METRONIDAZOLE 0.75 % VA GEL: 1.0000 | Freq: Two times a day (BID) | VAGINAL | 0 refills | Status: DC

## 2018-11-15 NOTE — Telephone Encounter (Signed)
Calling for test results ?

## 2018-11-15 NOTE — Telephone Encounter (Signed)
Returned pt's call regarding results.  Pt stated she was returning a call and was advised that she had been contacted regarding recent diagnosis of bacterial vaginosis.  Pt stated that she understood and had already started taking the flagyl po but was having difficulty in tolerating the medication and requested a cream.  Metrogel prescribed and sent to pt's pharmacy.   Pt verbalized understanding and expressed thanks.

## 2018-11-16 ENCOUNTER — Telehealth (INDEPENDENT_AMBULATORY_CARE_PROVIDER_SITE_OTHER): Payer: 59 | Admitting: *Deleted

## 2018-11-16 DIAGNOSIS — Z3490 Encounter for supervision of normal pregnancy, unspecified, unspecified trimester: Secondary | ICD-10-CM

## 2018-11-16 NOTE — Telephone Encounter (Signed)
Called pt regarding [employer].  Per chart review, pt had not logged blood pressure in several week.  Pt stated she just got her cuff and would take a blood pressure and log it today.

## 2018-11-20 LAB — OBSTETRIC PANEL, INCLUDING HIV
Antibody Screen: NEGATIVE
Basophils Absolute: 0 10*3/uL (ref 0.0–0.2)
Basos: 0 %
EOS (ABSOLUTE): 0.1 10*3/uL (ref 0.0–0.4)
Eos: 1 %
HIV Screen 4th Generation wRfx: NONREACTIVE
Hematocrit: 34 % (ref 34.0–46.6)
Hemoglobin: 11.5 g/dL (ref 11.1–15.9)
Hepatitis B Surface Ag: NEGATIVE
Immature Grans (Abs): 0 10*3/uL (ref 0.0–0.1)
Immature Granulocytes: 0 %
Lymphocytes Absolute: 2 10*3/uL (ref 0.7–3.1)
Lymphs: 23 %
MCH: 28.5 pg (ref 26.6–33.0)
MCHC: 33.8 g/dL (ref 31.5–35.7)
MCV: 84 fL (ref 79–97)
Monocytes Absolute: 0.7 10*3/uL (ref 0.1–0.9)
Monocytes: 9 %
Neutrophils Absolute: 5.7 10*3/uL (ref 1.4–7.0)
Neutrophils: 67 %
Platelets: 227 10*3/uL (ref 150–450)
RBC: 4.03 x10E6/uL (ref 3.77–5.28)
RDW: 12.8 % (ref 11.7–15.4)
RPR Ser Ql: NONREACTIVE
Rh Factor: POSITIVE
Rubella Antibodies, IGG: 2.68 index (ref >0.99)
WBC: 8.6 10*3/uL (ref 3.4–10.8)

## 2018-11-20 LAB — INHERITEST(R) CF/SMA PANEL

## 2018-11-20 LAB — SMN1 COPY NUMBER ANALYSIS (SMA CARRIER SCREENING)

## 2018-11-20 LAB — HEMOGLOBINOPATHY EVALUATION
Ferritin: 58 ng/mL (ref 15–150)
Hgb A2 Quant: 2.3 % (ref 1.8–3.2)
Hgb A: 97.7 % (ref 96.4–98.8)
Hgb C: 0 %
Hgb F Quant: 0 % (ref 0.0–2.0)
Hgb S: 0 %
Hgb Solubility: NEGATIVE
Hgb Variant: 0 %

## 2018-12-04 ENCOUNTER — Telehealth: Payer: Self-pay | Admitting: Obstetrics and Gynecology

## 2018-12-04 NOTE — Telephone Encounter (Signed)
Called the patient to inform of the upcoming mychart visit. The patient verbalized understanding. °

## 2018-12-05 ENCOUNTER — Other Ambulatory Visit: Payer: Self-pay

## 2018-12-05 ENCOUNTER — Ambulatory Visit (INDEPENDENT_AMBULATORY_CARE_PROVIDER_SITE_OTHER): Payer: 59

## 2018-12-05 VITALS — BP 114/70 | HR 84

## 2018-12-05 DIAGNOSIS — Z3492 Encounter for supervision of normal pregnancy, unspecified, second trimester: Secondary | ICD-10-CM

## 2018-12-05 DIAGNOSIS — Z3A16 16 weeks gestation of pregnancy: Secondary | ICD-10-CM

## 2018-12-05 DIAGNOSIS — Z3490 Encounter for supervision of normal pregnancy, unspecified, unspecified trimester: Secondary | ICD-10-CM

## 2018-12-05 NOTE — Progress Notes (Signed)
   TELEHEALTH VIRTUAL OBSTETRICS VISIT ENCOUNTER NOTE  I connected with [name] on 12/05/18 at 10:35 AM EDT by WebEx at home and verified that I am speaking with the correct person using two identifiers.   I discussed the limitations, risks, security and privacy concerns of performing an evaluation and management service by telephone and the availability of in person appointments. I also discussed with the patient that there may be a patient responsible charge related to this service. The patient expressed understanding and agreed to proceed.  Subjective:  Sierra Wu is a 23 y.o. G1P0 at [redacted]w[redacted]d being followed for ongoing prenatal care.  She is currently monitored for the following issues for this low-risk pregnancy and has Supervision of low-risk pregnancy and GBS bacteriuria on their problem list.  Patient reports no complaints. Reports fetal movement. Denies any contractions, bleeding or leaking of fluid.   The following portions of the patient's history were reviewed and updated as appropriate: allergies, current medications, past family history, past medical history, past social history, past surgical history and problem list.   Objective:   General:  Alert, oriented and cooperative.   Mental Status: Normal mood and affect perceived. Normal judgment and thought content.  Rest of physical exam deferred due to type of encounter  Assessment and Plan:  Pregnancy: G1P0 at [redacted]w[redacted]d 1. Encounter for supervision of low-risk pregnancy, antepartum -BP 114/70 -Anatomy u/s scheduled 6/9 -Patient interested in waterbirth. Discussed restrictions at this time and will continue to evaluate as pregnancy continues -Anticipatory guidance of next visits reviewed  Preterm labor symptoms and general obstetric precautions including but not limited to vaginal bleeding, contractions, leaking of fluid and fetal movement were reviewed in detail with the patient.  I discussed the assessment and  treatment plan with the patient. The patient was provided an opportunity to ask questions and all were answered. The patient agreed with the plan and demonstrated an understanding of the instructions. The patient was advised to call back or seek an in-person office evaluation/go to [facility] at [facility] for any urgent or concerning symptoms. Please refer to After Visit Summary for other counseling recommendations.   I provided 20 minutes of non-face-to-face time during this encounter.  Return in about 4 weeks (around 01/02/2019) for Return OB visit.  Future Appointments  Date Time Provider Department Center  12/26/2018 10:45 AM [facility] [place] 2 WH-M[facility] M[facility]-US    Rolm Bookbinder, CNM [facility] for [employer], [facility]

## 2018-12-26 ENCOUNTER — Other Ambulatory Visit: Payer: Self-pay

## 2018-12-26 ENCOUNTER — Ambulatory Visit (HOSPITAL_COMMUNITY): Payer: 59

## 2018-12-26 ENCOUNTER — Ambulatory Visit (HOSPITAL_COMMUNITY)
Admission: RE | Admit: 2018-12-26 | Discharge: 2018-12-26 | Disposition: A | Discharge status: Home / Self Care | Payer: 59 | Source: Ambulatory Visit | Attending: Obstetrics and Gynecology | Admitting: Obstetrics and Gynecology

## 2018-12-26 ENCOUNTER — Other Ambulatory Visit (HOSPITAL_COMMUNITY): Payer: Self-pay | Admitting: *Deleted

## 2018-12-26 DIAGNOSIS — Z3491 Encounter for supervision of normal pregnancy, unspecified, first trimester: Secondary | ICD-10-CM | POA: Insufficient documentation

## 2018-12-26 DIAGNOSIS — Z363 Encounter for antenatal screening for malformations: Secondary | ICD-10-CM

## 2018-12-26 DIAGNOSIS — Z362 Encounter for other antenatal screening follow-up: Secondary | ICD-10-CM

## 2018-12-26 DIAGNOSIS — Z3A19 19 weeks gestation of pregnancy: Secondary | ICD-10-CM | POA: Diagnosis not present

## 2019-01-01 ENCOUNTER — Telehealth: Payer: Self-pay | Admitting: Advanced Practice Midwife

## 2019-01-01 NOTE — Telephone Encounter (Signed)
Called the patient to confirm upcoming virtual appointment. The patient verbalized understanding and has the app downloaded. °

## 2019-01-02 ENCOUNTER — Telehealth (INDEPENDENT_AMBULATORY_CARE_PROVIDER_SITE_OTHER): Payer: 59

## 2019-01-02 ENCOUNTER — Other Ambulatory Visit: Payer: Self-pay

## 2019-01-02 VITALS — BP 119/70 | HR 80

## 2019-01-02 DIAGNOSIS — Z3A20 20 weeks gestation of pregnancy: Secondary | ICD-10-CM

## 2019-01-02 DIAGNOSIS — R8271 Bacteriuria: Secondary | ICD-10-CM

## 2019-01-02 DIAGNOSIS — Z3490 Encounter for supervision of normal pregnancy, unspecified, unspecified trimester: Secondary | ICD-10-CM

## 2019-01-02 DIAGNOSIS — O98812 Other maternal infectious and parasitic diseases complicating pregnancy, second trimester: Secondary | ICD-10-CM

## 2019-01-02 NOTE — Progress Notes (Signed)
I connected with  Sierra Wu on 01/02/19 at 10:55 AM EDT by telephone and verified that I am speaking with the correct person using two identifiers.   I discussed the limitations, risks, security and privacy concerns of performing an evaluation and management service by telephone and the availability of in person appointments. I also discussed with the patient that there may be a patient responsible charge related to this service. The patient expressed understanding and agreed to proceed.  Heath, CMA 01/02/2019  10:57 AM

## 2019-01-02 NOTE — Progress Notes (Signed)
   TELEHEALTH VIRTUAL OBSTETRICS VISIT ENCOUNTER NOTE  I connected with Sierra Wu on 01/02/19 at 10:55 AM EDT by MyChart Virtual Visit at home and verified that I am speaking with the correct person using two identifiers.   I discussed the limitations, risks, security and privacy concerns of performing an evaluation and management service by telephone and the availability of in person appointments. I also discussed with the patient that there may be a patient responsible charge related to this service. The patient expressed understanding and agreed to proceed.  Subjective:  Sierra Wu is a 23 y.o. G1P0 at [redacted]w[redacted]d being followed for ongoing prenatal care.  She is currently monitored for the following issues for this low-risk pregnancy and has Supervision of low-risk pregnancy and GBS bacteriuria on their problem list.  Patient reports occasional headaches. Reports fetal movement. Denies any contractions, bleeding or leaking of fluid.   The following portions of the patient's history were reviewed and updated as appropriate: allergies, current medications, past family history, past medical history, past social history, past surgical history and problem list.   Objective:   General:  Alert, oriented and cooperative.   Mental Status: Normal mood and affect perceived. Normal judgment and thought content.  Rest of physical exam deferred due to type of encounter  Assessment and Plan:  Pregnancy: G1P0 at [redacted]w[redacted]d 1. Encounter for supervision of low-risk pregnancy, antepartum -BP 118/71 -Reviewed lab results from NOB and ultrasound results from anatomy scan. Follow up scan scheduled for 7/7 -Comfort measures reviewed for headaches in pregnancy. Encouraged patient to increase hydration  2. GBS bacteriuria -Will need prophylaxis in labor  Preterm labor symptoms and general obstetric precautions including but not limited to vaginal bleeding, contractions, leaking of fluid and fetal movement  were reviewed in detail with the patient.  I discussed the assessment and treatment plan with the patient. The patient was provided an opportunity to ask questions and all were answered. The patient agreed with the plan and demonstrated an understanding of the instructions. The patient was advised to call back or seek an in-person office evaluation/go to [facility]U at [facility] for any urgent or concerning symptoms. Please refer to After Visit Summary for other counseling recommendations.   I provided 20 minutes of non-face-to-face time during this encounter.  Return in about 8 weeks (around 02/27/2019) for Return OB visit, 2hr GTT and labs.  Future Appointments  Date Time Provider [place]  01/23/2019  4:00 PM WH-MFC [place] 3 WH-MFCUS MFC-US     M , [place] for [employer], [place]

## 2019-01-23 ENCOUNTER — Ambulatory Visit (HOSPITAL_COMMUNITY)
Admission: RE | Admit: 2019-01-23 | Discharge: 2019-01-23 | Disposition: A | Discharge status: Home / Self Care | Payer: 59 | Source: Ambulatory Visit | Attending: Obstetrics and Gynecology | Admitting: Obstetrics and Gynecology

## 2019-01-23 ENCOUNTER — Other Ambulatory Visit: Payer: Self-pay

## 2019-01-23 DIAGNOSIS — Z3A23 23 weeks gestation of pregnancy: Secondary | ICD-10-CM | POA: Diagnosis not present

## 2019-01-23 DIAGNOSIS — Z362 Encounter for other antenatal screening follow-up: Secondary | ICD-10-CM

## 2019-01-27 DIAGNOSIS — B009 Herpesviral infection, unspecified: Secondary | ICD-10-CM

## 2019-01-29 MED ORDER — VALACYCLOVIR HCL 1 G PO TABS: 1000.0000 mg | ORAL_TABLET | Freq: Every day | ORAL | 5 refills | Status: DC

## 2019-02-20 ENCOUNTER — Encounter: Payer: Self-pay | Admitting: Family Medicine

## 2019-02-20 ENCOUNTER — Telehealth: Payer: Self-pay | Admitting: Student

## 2019-02-20 ENCOUNTER — Encounter: Payer: Self-pay | Admitting: Student

## 2019-02-20 NOTE — Telephone Encounter (Signed)
The patient called in for a dental referral to [facility]. Provided fax number, faxed the document as of today.

## 2019-02-22 ENCOUNTER — Other Ambulatory Visit: Payer: Self-pay | Admitting: Emergency Medicine

## 2019-02-22 DIAGNOSIS — Z3490 Encounter for supervision of normal pregnancy, unspecified, unspecified trimester: Secondary | ICD-10-CM

## 2019-02-27 ENCOUNTER — Telehealth: Payer: Self-pay | Admitting: Advanced Practice Midwife

## 2019-02-27 NOTE — Telephone Encounter (Signed)
Called the patient to confirm the upcoming in office visit scheduled. Informed the patient of our new location and no children or visitors due to covid19 restrictions. Also informed the patient of wearing a mask and using the hand sanitizer upon entry. The patient answered no the covid19 screening questions. °

## 2019-02-28 ENCOUNTER — Other Ambulatory Visit: Payer: Self-pay

## 2019-02-28 ENCOUNTER — Encounter: Payer: Self-pay | Admitting: Medical

## 2019-02-28 ENCOUNTER — Ambulatory Visit (INDEPENDENT_AMBULATORY_CARE_PROVIDER_SITE_OTHER): Payer: 59 | Admitting: Medical

## 2019-02-28 ENCOUNTER — Other Ambulatory Visit: Payer: 59

## 2019-02-28 VITALS — BP 116/71 | HR 88 | Temp 98.4°F | Wt 175.0 lb

## 2019-02-28 DIAGNOSIS — Z3A28 28 weeks gestation of pregnancy: Secondary | ICD-10-CM

## 2019-02-28 DIAGNOSIS — Z23 Encounter for immunization: Secondary | ICD-10-CM | POA: Diagnosis not present

## 2019-02-28 DIAGNOSIS — Z3493 Encounter for supervision of normal pregnancy, unspecified, third trimester: Secondary | ICD-10-CM

## 2019-02-28 DIAGNOSIS — B009 Herpesviral infection, unspecified: Secondary | ICD-10-CM

## 2019-02-28 DIAGNOSIS — Z3491 Encounter for supervision of normal pregnancy, unspecified, first trimester: Secondary | ICD-10-CM

## 2019-02-28 DIAGNOSIS — Z3490 Encounter for supervision of normal pregnancy, unspecified, unspecified trimester: Secondary | ICD-10-CM

## 2019-02-28 DIAGNOSIS — O99013 Anemia complicating pregnancy, third trimester: Secondary | ICD-10-CM

## 2019-02-28 DIAGNOSIS — R8271 Bacteriuria: Secondary | ICD-10-CM

## 2019-02-28 HISTORY — DX: Herpesviral infection, unspecified: B00.9

## 2019-02-28 NOTE — Addendum Note (Signed)
Addended by: Bethanne Ginger on: 02/28/2019 03:16 PM   Modules accepted: Orders

## 2019-02-28 NOTE — Patient Instructions (Addendum)
Braxton Hicks Contractions Contractions of the uterus can occur throughout pregnancy, but they are not always a sign that you are in labor. You may have practice contractions called Braxton Hicks contractions. These false labor contractions are sometimes confused with true labor. What are [place] contractions? Braxton Hicks contractions are tightening movements that occur in the muscles of the uterus before labor. Unlike true labor contractions, these contractions do not result in opening (dilation) and thinning of the cervix. Toward the end of pregnancy (32-34 weeks), Braxton Hicks contractions can happen more often and may become stronger. These contractions are sometimes difficult to tell apart from true labor because they can be very uncomfortable. You should not feel embarrassed if you go to the hospital with false labor. Sometimes, the only way to tell if you are in true labor is for your health care provider to look for changes in the cervix. The health care provider will do a physical exam and may monitor your contractions. If you are not in true labor, the exam should show that your cervix is not dilating and your water has not broken. If there are no other health problems associated with your pregnancy, it is completely safe for you to be sent home with false labor. You may continue to have Braxton Hicks contractions until you go into true labor. How to tell the difference between true labor and false labor True labor  Contractions last 30-70 seconds.  Contractions become very regular.  Discomfort is usually felt in the top of the uterus, and it spreads to the lower abdomen and low back.  Contractions do not go away with walking.  Contractions usually become more intense and increase in frequency.  The cervix dilates and gets thinner. False labor  Contractions are usually shorter and not as strong as true labor contractions.  Contractions are usually irregular.  Contractions  are often felt in the front of the lower abdomen and in the groin.  Contractions may go away when you walk around or change positions while lying down.  Contractions get weaker and are shorter-lasting as time goes on.  The cervix usually does not dilate or become thin. Follow these instructions at home:   Take over-the-counter and prescription medicines only as told by your health care provider.  Keep up with your usual exercises and follow other instructions from your health care provider.  Eat and drink lightly if you think you are going into labor.  If Braxton Hicks contractions are making you uncomfortable: ? Change your position from lying down or resting to walking, or change from walking to resting. ? Sit and rest in a tub of warm water. ? Drink enough fluid to keep your urine pale yellow. Dehydration may cause these contractions. ? Do slow and deep breathing several times an hour.  Keep all follow-up prenatal visits as told by your health care provider. This is important. Contact a health care provider if:  You have a fever.  You have continuous pain in your abdomen. Get help right away if:  Your contractions become stronger, more regular, and closer together.  You have fluid leaking or gushing from your vagina.  You pass blood-tinged mucus (bloody show).  You have bleeding from your vagina.  You have low back pain that you never had before.  You feel your baby's head pushing down and causing pelvic pressure.  Your baby is not moving inside you as much as it used to. Summary  Contractions that occur before labor are  called Braxton Hicks contractions, false labor, or practice contractions.  Braxton Hicks contractions are usually shorter, weaker, farther apart, and less regular than true labor contractions. True labor contractions usually become progressively stronger and regular, and they become more frequent.  Manage discomfort from [facility] contractions  by changing position, resting in a warm bath, drinking plenty of water, or practicing deep breathing. This information is not intended to replace advice given to you by your health care provider. Make sure you discuss any questions you have with your health care provider. Document Released: 11/18/2016 Document Revised: 06/17/2017 Document Reviewed: 11/18/2016 Elsevier Patient Education  2020 [place]. Fetal Movement Counts Patient Name: ________________________________________________ Patient Due Date: ____________________ What is a fetal movement count?  A fetal movement count is the number of times that you feel your baby move during a certain amount of time. This may also be called a fetal kick count. A fetal movement count is recommended for every pregnant woman. You may be asked to start counting fetal movements as early as week 28 of your pregnancy. Pay attention to when your baby is most active. You may notice your baby's sleep and wake cycles. You may also notice things that make your baby move more. You should do a fetal movement count:  When your baby is normally most active.  At the same time each day. A good time to count movements is while you are resting, after having something to eat and drink. How do I count fetal movements? 1. Find a quiet, comfortable area. Sit, or lie down on your side. 2. Write down the date, the start time and stop time, and the number of movements that you felt between those two times. Take this information with you to your health care visits. 3. For 2 hours, count kicks, flutters, swishes, rolls, and jabs. You should feel at least 10 movements during 2 hours. 4. You may stop counting after you have felt 10 movements. 5. If you do not feel 10 movements in 2 hours, have something to eat and drink. Then, keep resting and counting for 1 hour. If you feel at least 4 movements during that hour, you may stop counting. Contact a health care provider  if:  You feel fewer than 4 movements in 2 hours.  Your baby is not moving like he or she usually does. Date: ____________ Start time: ____________ Stop time: ____________ Movements: ____________ Date: ____________ Start time: ____________ Stop time: ____________ Movements: ____________ Date: ____________ Start time: ____________ Stop time: ____________ Movements: ____________ Date: ____________ Start time: ____________ Stop time: ____________ Movements: ____________ Date: ____________ Start time: ____________ Stop time: ____________ Movements: ____________ Date: ____________ Start time: ____________ Stop time: ____________ Movements: ____________ Date: ____________ Start time: ____________ Stop time: ____________ Movements: ____________ Date: ____________ Start time: ____________ Stop time: ____________ Movements: ____________ Date: ____________ Start time: ____________ Stop time: ____________ Movements: ____________ This information is not intended to replace advice given to you by your health care provider. Make sure you discuss any questions you have with your health care provider. Document Released: 08/04/2006 Document Revised: 07/25/2018 Document Reviewed: 08/14/2015 Elsevier Patient Education  2020 [place] Education Options: [facility] Department Classes:  Childbirth education classes can help you get ready for a positive parenting experience. You can also meet other expectant parents and get free stuff for your baby. Each class runs for five weeks on the same night and costs $45 for the mother-to-be and her support person. Medicaid covers the cost if  you are eligible. Call [phone] to register. [facility] Childbirth Education:  [phone] or [phone] or [email or web address]  Baby & Me Class: Discuss newborn & infant parenting and family adjustment issues with other new mothers in a relaxed environment. Each week brings a new speaker or  baby-centered activity. We encourage new mothers to join [place] every Thursday at 11:00am. Babies birth until crawling. No registration or fee. Daddy [employer]: This course offers Dads-to-be the tools and knowledge needed to feel confident on their journey to becoming new fathers. Experienced dads, who have been trained as coaches, teach dads-to-be how to hold, comfort, diaper, swaddle and play with their infant while being able to support the new mom as well. A class for men taught by men. $25/dad Big Brother/Big Sister: Let your children share in the joy of a new brother or sister in this special class designed just for them. Class includes discussion about how families care for babies: swaddling, holding, diapering, safety as well as how they can be helpful in their new role. This class is designed for children ages 62 to 64, but any age is welcome. Please register each child individually. $5/child  Mom Talk: This mom-led group offers support and connection to mothers as they journey through the adjustments and struggles of that sometimes overwhelming first year after the birth of a child. Tuesdays at 10:00am and Thursdays at 6:00pm. Babies welcome. No registration or fee. Breastfeeding Support Group: This group is a mother-to-mother support circle where moms have the opportunity to share their breastfeeding experiences. A Lactation Consultant is present for questions and concerns. Meets each Tuesday at 11:00am. No fee or registration. Breastfeeding Your Baby: Learn what to expect in the first days of breastfeeding your newborn.  This class will help you feel more confident with the skills needed to begin your breastfeeding experience. Many new mothers are concerned about breastfeeding after leaving the hospital. This class will also address the most common fears and challenges about breastfeeding during the first few weeks, months and beyond. (call for fee) Comfort Techniques and Tour: This 2 hour interactive  class will provide you the opportunity to learn & practice hands-on techniques that can help relieve some of the discomfort of labor and encourage your baby to rotate toward the best position for birth. You and your partner will be able to try a variety of labor positions with birth balls and rebozos as well as practice breathing, relaxation, and visualization techniques. A tour of the [facility] is included with this class. $20 per registrant and support person Childbirth Class- Weekend Option: This class is a Weekend version of our Birth & Baby series. It is designed for parents who have a difficult time fitting several weeks of classes into their schedule. It covers the care of your newborn and the basics of labor and childbirth. It also includes a [place] [facility] and lunch. The class is held two consecutive days: beginning on Friday evening from 6:30 - 8:30 p.m. and the next day, Saturday from 9 a.m. - 4 p.m. (call for fee) Doren Custard Class: Interested in a waterbirth?  This informational class will help you discover whether waterbirth is the right fit for you. Education about waterbirth itself, supplies you would need and how to assemble your support team is what you can expect from this class. Some obstetrical practices require this class in order to pursue a waterbirth. (Not all obstetrical practices offer waterbirth-check with your healthcare provider.) Register  only the expectant mom, but you are encouraged to bring your partner to class! Required if planning waterbirth, no fee. Infant/Child CPR: Parents, grandparents, babysitters, and friends learn Cardio-Pulmonary Resuscitation skills for infants and children. You will also learn how to treat both conscious and unconscious choking in infants and children. This Family & Friends program does not offer certification. Register each participant individually to ensure that enough mannequins are  available. (Call for fee) Grandparent Love: Expecting a grandbaby? This class is for you! Learn about the latest infant care and safety recommendations and ways to support your own child as he or she transitions into the parenting role. Taught by Registered Nurses who are childbirth instructors, but most importantly...they are grandmothers too! $10/person. Childbirth Class- Natural Childbirth: This series of 5 weekly classes is for expectant parents who want to learn and practice natural methods of coping with the process of labor and childbirth. Relaxation, breathing, massage, visualization, role of the partner, and helpful positioning are highlighted. Participants learn how to be confident in their body's ability to give birth. This class will empower and help parents make informed decisions about their own care. Includes discussion that will help new parents transition into the immediate postpartum period. [facility] is included. We suggest taking this class between 25-32 weeks, but it's only a recommendation. $75 per registrant and one support person or $30 Medicaid. Childbirth Class- 3 week Series: This option of 3 weekly classes helps you and your labor partner prepare for childbirth. Newborn care, labor & birth, cesarean birth, pain management, and comfort techniques are discussed and a [place] [facility] is included. The class meets at the same time, on the same day of the week for 3 consecutive weeks beginning with the starting date you choose. $60 for registrant and one support person.  Marvelous Multiples: Expecting twins, triplets, or more? This class covers the differences in labor, birth, parenting, and breastfeeding issues that face multiples' parents. NICU tour is included. Led by a Certified Childbirth Educator who is the mother of twins. No fee. Caring for Baby: This class is for expectant and adoptive parents who want to learn  and practice the most up-to-date newborn care for their babies. Focus is on birth through the first six weeks of life. Topics include feeding, bathing, diapering, crying, umbilical cord care, circumcision care and safe sleep. Parents learn to recognize symptoms of illness and when to call the pediatrician. Register only the mom-to-be and your partner or support person can plan to come with you! $10 per registrant and support person Childbirth Class- online option: This online class offers you the freedom to complete a Birth and Baby series in the comfort of your own home. The flexibility of this option allows you to review sections at your own pace, at times convenient to you and your support people. It includes additional video information, animations, quizzes, and extended activities. Get organized with helpful eClass tools, checklists, and trackers. Once you register online for the class, you will receive an email within a few days to accept the invitation and begin the class when the time is right for you. The content will be available to you for 60 days. $60 for 60 days of online access for you and your support people.  Local Doulas: Natural Baby Doulas [email or web address] Tel: [phone] [email or web address] [employer] [phone] [email or web address] The Labor Hassell Halim  (also do waterbirth tub rental) [phone] [email or web address] [facility] Birth Doula [phone] [email or web address]  [place] [email or web address]/ [employer] (PADA) [email or web address] [place] Birth and Baby  [email or web address] Considering Waterbirth? Guide for patients at [facility] [employer]  Why consider waterbirth?  . Gentle birth for babies . Less pain medicine used  in labor . May allow for passive descent/less pushing . May reduce perineal tears  . More mobility and instinctive maternal position changes . Increased maternal relaxation . Reduced blood pressure in labor  Is waterbirth safe? What are the risks of infection, drowning or other complications?  . Infection: o Very low risk (3.7 % for tub vs 4.8% for bed) o 7 in 8000 waterbirths with documented infection o Poorly cleaned equipment most common cause o Slightly lower group B strep transmission rate  . Drowning o Maternal:  - Very low risk   - Related to seizures or fainting o Newborn:  - Very low risk. No evidence of increased risk of respiratory problems in multiple large studies - Physiological protection from breathing under water - Avoid underwater birth if there are any fetal complications - Once baby's head is out of the water, keep it out.  . Birth complication o Some reports of cord trauma, but risk decreased by bringing baby to surface gradually o No evidence of increased risk of shoulder dystocia. Mothers can usually change positions faster in water than in a bed, possibly aiding the maneuvers to free the shoulder.   You must attend a Doren Custard class at [facility]  3rd Wednesday of every month from 7-9pm  [employer] by calling [phone] or online at [email or web address]  Bring [place] the certificate from the class to your prenatal appointment  Meet with a midwife at 36 weeks to see if you can still plan a waterbirth and to sign the consent.   Purchase or rent the following supplies:   Water Birth Pool (Birth Pool in a Box or [place] for instance)  (Tubs start ~$125)  Single-use disposable tub liner designed for your brand of tub  New garden hose labeled "lead-free", "suitable for drinking water",  Electric drain pump to remove water (We recommend 792 gallon per hour or greater pump.)   Separate garden hose to remove the dirty water  Fish  net  Bathing suit top (optional)  Long-handled mirror (optional)  Places to purchase or rent supplies  [email or web address].is for tub purchases and supplies  Waterbirthsolutions.com for tub purchases and supplies  The Labor Ladies ([email or web address]) $275 for tub rental/set-up & take down/kit   [employer] (http://www.fleming.com/.htm) Information regarding doulas (labor support) who provide pool rentals  Our practice has a Birth Pool in a Box tub at the hospital that you may borrow on a first-come-first-served basis. It is your responsibility to to set up, clean and break down the tub. We cannot guarantee the availability of this tub in advance. You are responsible for bringing all accessories listed above. If you do not have all necessary supplies you cannot have a waterbirth.    Things that would prevent you from having a waterbirth:  Premature, <37wks  Previous cesarean birth  Presence of thick meconium-stained fluid  Multiple gestation (Twins, triplets, etc.)  Uncontrolled diabetes or gestational diabetes requiring medication  Hypertension requiring medication or diagnosis of pre-eclampsia  Heavy vaginal bleeding  Non-reassuring fetal heart rate  Active infection (MRSA, etc.). Group B Strep is NOT a contraindication for  waterbirth.  If your labor has to be induced and induction method requires continuous  monitoring of the baby's  heart rate  Other risks/issues identified by your obstetrical provider  Please remember that birth is unpredictable. Under certain unforeseeable circumstances your provider may advise against giving birth in the tub. These decisions will be made on a case-by-case basis and with the safety of you and your baby as our highest priority.      Iron-Rich Diet  Iron is a mineral that helps your body to produce hemoglobin. Hemoglobin is a protein in red blood cells that carries oxygen to your body's tissues. Eating  too little iron may cause you to feel weak and tired, and it can increase your risk of infection. Iron is naturally found in many foods, and many foods have iron added to them (iron-fortified foods). You may need to follow an iron-rich diet if you do not have enough iron in your body due to certain medical conditions. The amount of iron that you need each day depends on your age, your sex, and any medical conditions you have. Follow instructions from your health care provider or a diet and nutrition specialist (dietitian) about how much iron you should eat each day. What are tips for following this plan? Reading food labels  Check food labels to see how many milligrams (mg) of iron are in each serving. Cooking  Cook foods in pots and pans that are made from iron.  Take these steps to make it easier for your body to absorb iron from certain foods: ? Soak beans overnight before cooking. ? Soak whole grains overnight and drain them before using. ? Ferment flours before baking, such as by using yeast in bread dough. Meal planning  When you eat foods that contain iron, you should eat them with foods that are high in vitamin C. These include oranges, peppers, tomatoes, potatoes, and mango. Vitamin C helps your body to absorb iron. General information  Take iron supplements only as told by your health care provider. An overdose of iron can be life-threatening. If you were prescribed iron supplements, take them with orange juice or a vitamin C supplement.  When you eat iron-fortified foods or take an iron supplement, you should also eat foods that naturally contain iron, such as meat, poultry, and fish. Eating naturally iron-rich foods helps your body to absorb the iron that is added to other foods or contained in a supplement.  Certain foods and drinks prevent your body from absorbing iron properly. Avoid eating these foods in the same meal as iron-rich foods or with iron supplements. These foods  include: ? Coffee, black tea, and red wine. ? Milk, dairy products, and foods that are high in calcium. ? Beans and soybeans. ? Whole grains. What foods should I eat? Fruits Prunes. Raisins. Eat fruits high in vitamin C, such as oranges, grapefruits, and strawberries, alongside iron-rich foods. Vegetables Spinach (cooked). Green peas. Broccoli. Fermented vegetables. Eat vegetables high in vitamin C, such as leafy greens, potatoes, bell peppers, and tomatoes, alongside iron-rich foods. Grains Iron-fortified breakfast cereal. Iron-fortified whole-wheat bread. Enriched rice. Sprouted grains. Meats and other proteins Beef liver. Oysters. Beef. Shrimp. [place]. Chicken. [place]. Sardines. Chickpeas. Nuts. Tofu. Pumpkin seeds. Beverages Tomato juice. Fresh orange juice. Prune juice. Hibiscus tea. Fortified instant breakfast shakes. Sweets and desserts Blackstrap molasses. Seasonings and condiments Tahini. Fermented soy sauce. Other foods Wheat germ. The items listed above may not be a complete list of recommended foods and beverages. Contact a dietitian for more information. What foods should I avoid? Grains Whole grains. Bran cereal. Bran flour. Oats. Meats and  other proteins Soybeans. Products made from soy protein. Black beans. Lentils. Mung beans. Split peas. Dairy Milk. Cream. Cheese. Yogurt. Cottage cheese. Beverages Coffee. Black tea. Red wine. Sweets and desserts Cocoa. Chocolate. Ice cream. Other foods Basil. Oregano. Large amounts of parsley. The items listed above may not be a complete list of foods and beverages to avoid. Contact a dietitian for more information. Summary  Iron is a mineral that helps your body to produce hemoglobin. Hemoglobin is a protein in red blood cells that carries oxygen to your body's tissues.  Iron is naturally found in many foods, and many foods have iron added to them (iron-fortified foods).  When you eat foods that contain iron, you should  eat them with foods that are high in vitamin C. Vitamin C helps your body to absorb iron.  Certain foods and drinks prevent your body from absorbing iron properly, such as whole grains and dairy products. You should avoid eating these foods in the same meal as iron-rich foods or with iron supplements. This information is not intended to replace advice given to you by your health care provider. Make sure you discuss any questions you have with your health care provider. Document Released: 02/16/2005 Document Revised: 06/17/2017 Document Reviewed: 05/31/2017 Elsevier Patient Education  2020 [place] [phone]) . [facility] Davy Pique, MD; Gwendlyn Deutscher, MD; Walker Kehr, MD; Andria Frames, MD; McDiarmid, MD; Dutch Quint, MD; Nori Riis, MD; [address], [address] [place], [place] [address] o [phone] o Mon-Fri 8:30-12:30, 1:30-5:00 o Providers come to see [facility] at [facility] [facility] o Accepting Medicaid . [place] [facility] [place] providers who accept newborns: Dorthy Cooler, MD; Orland Mustard, MD; Stephanie Acre, MD o [place], [place], [place] [address] o [phone] o Mon-Fri 8:00-5:30 o Babies seen by providers at [facility] o Does NOT accept Medicaid o Please call early in hospitalization for appointment (limited availability)  . Mustard [place], MD o [address], [place], [place] [address] o [phone] o Mon, Tue, Thur, Fri 8:30-5:00, Wed 10:00-7:00 (closed 1-2pm) o Babies seen by [facility] providers o Accepting Medicaid . [place], MD o [place], [place], [place] [address] o [phone] o Mon-Fri 8:30-5:00, Sat 8:30-12:00 o Provider comes to see babies at [place] Medicaid o Must have been referred from current patients or contacted office prior to delivery . [facility] ([facility] [place]) Franne Forts, MD; Tamera Punt, MD; Doneen Poisson, MD; Fatima Sanger, MD; Wynetta Emery, MD; Jess Barters, MD; Tami Ribas, MD; Herbert Moors, MD; Derrell Lolling, MD; Dorothyann Peng, MD; Lucious Groves, NP; Baldo Ash, NP o [address], [place], [place] [address] o [phone] o Mon, Tue, Thur, Fri 8:30-5:30, Wed 9:30-5:30, Sat 8:30-12:30 o Babies seen by [facility] providers o Accepting Medicaid o Only accepting infants of first-time parents or siblings of current patients [facility] discharge coordinator will make follow-up appointment . Baltazar Najjar o [place] [address], [place], [place]  [address] o [phone]   Fax - [phone] . [facility] o [address], [place], [place]  [address] o Phone - [phone]   Fax [phone] . [place], [place], [place], [place]  [address] o [phone]  [place] [phone]) . [facility] Reginal Lutes, MD; Jacklynn Ganong, MD; Torrie Mayers, MD; MD; Rosana Hoes, MD; Servando Salina, MD; Rose Fillers, MD; Rex Kras,  MD; Corinna Capra, MD; Volney American, MD; Trilby Drummer, MD; Janann Colonel, MD; Jimmye Norman, [place], [place], [place] [address] o [phone] o Mon-Fri 8:30-5:00 (extended evenings Mon-Thur as needed), Sat-Sun 10:00-1:00 o Providers come to see babies at [place] Medicaid for families of first-time babies and families with all children in the household age 79 and under. Must register with office prior to making appointment (M-F only). . [place], NP; Tomi Bamberger, MD; Redmond School, MD; [employer], [place] [address] [place], [place] [address] o [phone] o Mon-Fri 8:00-5:00 o Babies seen by providers at [facility] o Does NOT accept Medicaid/Commercial Insurance Only . [facility] [place] ([facility])  Marnee Guarneri, MD; Drema Dallas, MD; Montine Circle, MD; Vilma Prader, MD; Vanita Panda, MD; Alfonso Ramus, MD; Ruthann Cancer, MD;  Roxanne Mins, MD; Rosalva Ferron, MD; Polly Cobia, MD o [address] [place], [place] [address] o [phone] o Mon-Fri 8:30-5:30, Sat (Oct.-Mar.) 9:00-1:00 o Babies seen by providers at [place] [phone]) . [facility] Peers, MD; Suzan Slick, MD o [address], [place], [place] [address] o [phone] o Mon-Fri 8:30-5:00, Sat 8:30-12:00 o Providers come to see babies at [facility] o Does NOT accept Medicaid . [place] at [place], [place]; [place], MD; [place], [place]; Nancy Fetter, MD; Moreen Fowler, [place], [place], [place] [address] o [phone] o Mon-Fri 8:00-5:00 o Babies seen by providers at [facility] o Does NOT accept Medicaid o Only accepting babies of parents who are patients o Please call early in hospitalization for appointment (limited availability) . [facility] Blanca Friend, MD; Sharlene Motts, MD; Rod Can, MD; Warner Mccreedy, NP; Sabra Heck, MD; Ermalinda Memos, MD; Sharlett Iles, NP; Aurther Loft, MD; Jerrye Beavers, MD; Marcello Moores, MD; Berline Lopes, MD; Charolette Forward, MD o [place]. [place], [place], [place] [address] o [phone] o Mon-Fri 8:00-5:00, Sat 9:00-12:00 o Providers come to see babies at [facility] o Does NOT accept [facility] [phone]) . [place] [facility] [place] providers accepting new patients: Dayna Ramus, NP; [place], [place], [place], [place] [address] o [phone] o Mon-Fri 8:00-5:00 o Babies seen by providers at [facility] o Does NOT accept Medicaid o Only accepting babies of parents who are patients o Please call early in hospitalization for appointment (limited availability) . [facility] Oswaldo Conroy, MD; Sheran Lawless, MD o [address] [place], [place] [address] o [phone] (press 1 to schedule appointment) o Mon-Fri 8:00-5:00 o Providers come to see babies at [facility] o Does NOT accept Medicaid . [facility] Jodi Mourning, MD o [address] [place], [place] [address] o [phone] o Mon-Fri 8:30-5:00 (lunch 12:30-1:00), extended hours by appointment only Wed 5:00-6:30 o Babies seen by [facility] providers o Accepting Medicaid . [place] at Evalyn Casco, MD; [place], MD; Ethlyn Gallery, MD o [place], [place], [place] [address] o [phone] o Mon-Fri 8:00-5:00 o Babies seen by [facility] providers o Does NOT accept Medicaid . Therapist, music at [place], MD; Yong Channel, MD; [place], [place] [address] [place], [place] [address] o [phone] o Mon-Fri 8:00-5:00 o Babies seen by [facility] providers o Does NOT accept Medicaid . [place], [place]; [place], [place]; [place], NP; Albertina Parr, MD; Frederic Jericho, MD; Ronney Lion, MD; Carlos Levering, NP; Jerelene Redden, NP; Tomasita Crumble, NP; Ronelle Nigh, NP; Corinna Lines, MD; [place], MD o [address]., [place], [place] [address] o [phone] o Mon-Fri  8:30-5:00, Sat 10:00-1:00 o Providers come to see babies at [facility] o Does NOT accept Medicaid o Free prenatal information session Tuesdays at 4:45pm . [facility] Porfirio Oar, MD; [place], [place]; [place], [place]; [place], [place]., [place] [address] o [phone] o Mon-Fri 7:30-5:30 o Babies seen by [facility] providers . [facility] o [address], [place], [place], [place]  [address] o [phone]   Fax - [phone]  [place] [phone] & [phone]) . [place] o [address] [place], [place] [address] o [phone] o Mon-Thur 8:00-6:00 o Providers come to see babies at [place] Medicaid . [place]. [place], NP; Melford Aase, MD; [place], [place]; [place], [place]., [place], [place] [address] o [phone] o Mon-Thur 7:30-7:30, Fri 7:30-4:30 o Babies seen by [facility]  providers o Accepting Medicaid . [facility] Nyra Jabs, MD; Cristino Martes, NP; Gertie Baron, MD o [place] [address], [place], [place] [address] o [phone] o Mon-Fri 8:30-5:00, Sat 8:30-12:00 o Providers come to see babies at [place] Medicaid o Must have "Meet & Greet" appointment at office prior to delivery . [place] ([place]) Jodene Nam, MD; Juleen China, MD; Clydene Laming, [place] [address], [place], [place] [address] o [phone] o Mon-Wed 8:00-6:00, Thur-Fri 8:00-5:00, Sat 9:00-12:00 o Providers come to see babies at [facility] o Does NOT accept Medicaid o Only accepting siblings of current patients . [facility] [place], [place], [place], [place]  [address] o [phone]   Fax [phone] . [place] at [place] [address] [place], [place]  [address] o [phone]   Fax - [place] [phone] & [phone]) . Therapist, music at [place], DO; [place], [place]., [place], [place] [address] o [phone] o Mon-Fri 7:00-5:00 o Babies seen by [facility] providers o Does NOT accept Medicaid . [place], MD; [place], [place]; [place], [place], [place], [place] [address] o [phone] o Mon-Fri 8:00-5:00 o Babies seen by [facility] providers o Accepting Medicaid . [place], MD; [place], [place]; [place], NP; [place], [place], [place], [place] [address] o [phone] o Mon-Fri 8:00-5:00 o Babies seen by providers at [place] [phone]) . [facility] at [place], [place] o [address]., [place], [place]  [address] o [phone] o Mon-Fri 8:00-5:00 o Babies seen by [facility] providers o Does NOT accept Medicaid o Limited availability, please call early in hospitalization to schedule follow-up . [facility] Leilani Merl, PA; Maisie Fus, MD; [place], MD; [place], [place]; Jeannine Kitten, MD; [place], [facility] [address], [place],  [address] o [phone] o Mon-Fri 8:30-5:00, Sat 9:00-12:00 o Babies seen by providers at [facility] o Accepting Medicaid o Please register online then schedule online or call office o [email or web address] . [place] ([place] [facility] [place]) Kristian Covey, NP; Dwyane Dee, MD; Leonidas Romberg, PA o [address] [place], [place],  [address] o [phone] o Mon-Fri 8:00-5:00 o Babies seen by providers at [facility] o Accepting Medicaid . [facility] [employer] at [employer]) Dairl Ponder, MD; Rayvon Char, NP; Melina Modena, MD o [address] [place], [place], [place] [address] o [phone] o Mon-Fri 8:00-5:30, Sat&Sun by appointment (phones open at 8:30) o Babies seen by [facility] providers o Accepting Medicaid o Must be a first-time baby or sibling of current patient . [place], [address], [place], [place]  [address] o [phone]   Fax - [phone]  [place] [phone] & [phone]) . [place], [place]; [place], [place]; Benjamine Mola, MD; [place], [place]; Harrell Lark, MD o [address], [place], [place] [address] o [phone] o Mon-Thur 8:00-7:00, Fri 8:00-5:00, Sat 8:00-12:00, Sun 9:00-12:00 o Babies seen by [facility] providers o Accepting Medicaid . [facility], MD; Ruthann Cancer, MD; [facility], MD o [address], [place], [place] [address] o [phone] o Mon-Thur 8:00-5:00 o Babies seen by providers at [facility] o Accepting  Medicaid . [facility] at [place], MD; Coe[place], MD; Amedeo Plenty, MD; Bobby Rumpf, MD; List, MD; Lavonia Drafts, MD; Ruthann Cancer, MD; Selinda Eon, MD; Audie Box, MD; Jim Like, MD; Christie Nottingham, MD; Hubbard Hartshorn, MD; Modena Nunnery, MD o [address] [place], [place] [address] o [phone] o Mon-Fri 8:00-5:30, Sat (Oct.-Mar.) 9:00-1:00 o Babies seen by providers at [facility] o Accepting Medicaid o Must fill out new patient packet, available online at [email or web address] . [place] ([facility]) Barnabas Lister, NP; Kenton Kingfisher, NP; Claiborne Billings, NP; Rolla Plate, MD; [place], [place]; Carola Rhine, MD; Tyron Russell, MD; Delia Chimes, NP o [address], [place], [place] [address] o [phone] o Mon-Thur 8:00-5:30, Fri 8:00-5:00 o Babies seen by providers at [place] [phone]) . [place], [place]; [place], MD; Dennard Schaumann, MD; [place], [place] o [address] [employer] [place], [place] [address] o [phone] o Mon-Fri 8:00-5:00 o Babies seen by providers at [place] [facility] [phone]) . [place] at [place], [place]; Olen Pel, MD; [place], [place], [place], [place] [address] o [phone] o Mon-Fri 8:00-5:00 o Babies seen by providers at [facility] o Does NOT accept Medicaid o Limited appointment availability, please call early in hospitalization  . Therapist, music at [place], [place]; [place], [address] [place], [place] [address] o [phone] o Mon-Fri 8:00-5:00 o Babies seen by [facility] providers o Does NOT accept Medicaid . [facility] Su Grand, MD; Guy Sandifer, MD; [place], [place]; [place], [place] [address], [place], [place] [address] o [phone] o Mon-Fri 8:00-5:00 o After hours clinic [facility] [place], [place] [address])  [phone] Mon-Fri 5:00-8:00, Sat 12:00-6:00, Sun 10:00-4:00 o Babies seen by [facility] providers o Accepting Medicaid . [place] at [facility] o [address], [place], [place]  [address] o [phone]   Fax - [phone]  [place] [phone]) . Therapist, music at [facility], MD o [address] [place], [place], [place] [address] o [phone] o Mon-Fri 8:00-5:00 o Babies seen by [facility] providers o Does NOT accept Medicaid . [place] ([place] at [place]) Bing Neighbors, MD o [address], [place], [place] [address] o [phone] o Mon-Thur  8:00-7:00, Fri 8:00-5:00, Sat 8:00-12:00 o Babies seen by providers at [facility] o Accepting Medicaid - but does not have vaccinations in office (must be received elsewhere) o Limited availability, please call early in hospitalization  [place] ([phone]) . [place], [place] [address] [place] [address] o [phone]  Fax [phone]  Places to have your son circumcised:                                                                      [facility]     [phone]   [phone] while you are in hospital         [facility]              [phone]   $269 by 4 wks                      [place]                     [phone]   $269 by 7 days [facility]                    [phone]   $269 by 4 wks [facility]             [phone]   $225 by 2 wks    These prices sometimes change but are roughly what you can expect to pay. Please call and confirm pricing.   Circumcision is considered an elective/non-medically necessary procedure. There are many reasons parents decide to have their sons circumsized. During the first year of life circumcised males have a reduced risk of urinary tract infections but after this year the rates between circumcised males and uncircumcised males are the same.  It is safe to have your  son circumcised outside of the hospital and the places above perform them regularly.   Deciding about Circumcision in Baby Boys  (Up-to-date The Basics)  What is circumcision?   Circumcision is a surgery that removes the skin that covers the tip of the penis, called the "foreskin" Circumcision is usually done when a boy is between 23 and 50 days old. In the [place], circumcision is common. In some other countries, fewer boys are circumcised. Circumcision is a common tradition in some religions.  Should I have my baby boy circumcised?   There is no easy answer. Circumcision has some benefits. But it also has risks. After talking with your doctor, you will have to decide for yourself what is right for your family.  What are the benefits of circumcision?   Circumcised boys seem to have slightly lower rates of: ?Urinary tract infections ?Swelling of the opening at the tip of the penis Circumcised men seem to have slightly lower rates of: ?Urinary tract infections ?Swelling of the opening at the tip of the penis ?Penis cancer ?HIV and other infections that you catch during sex ?Cervical cancer in the women they have sex with Even so, in the [place], the risks of these problems are small - even in boys and men who have not been circumcised. Plus, boys and men who are not circumcised can reduce these extra risks by: ?Cleaning their penis well ?Using  condoms during sex  What are the risks of circumcision?  Risks include: ?Bleeding or infection from the surgery ?Damage to or amputation of the penis ?A chance that the doctor will cut off too much or not enough of the foreskin ?A chance that sex won't feel as good later in life Only about 1 out of every 200 circumcisions leads to problems. There is also a chance that your health insurance won't pay for circumcision.  How is circumcision done in baby boys?  First, the baby gets medicine for pain relief. This might be a cream on  the skin or a shot into the base of the penis. Next, the doctor cleans the baby's penis well. Then he or she uses special tools to cut off the foreskin. Finally, the doctor wraps a bandage (called gauze) around the baby's penis. If you have your baby circumcised, his doctor or nurse will give you instructions on how to care for him after the surgery. It is important that you follow those instructions carefully.

## 2019-02-28 NOTE — Progress Notes (Signed)
   PRENATAL VISIT NOTE  Subjective:  Sierra Wu is a 23 y.o. G1P0 at [redacted]w[redacted]d being seen today for ongoing prenatal care.  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  Patient reports pelvic pressure and craving to eat a sponge.  Contractions: Not present. Vag. Bleeding: None.  Movement: Present. Denies leaking of fluid.   The following portions of the patient's history were reviewed and updated as appropriate: allergies, current medications, past family history, past medical history, past social history, past surgical history and problem list.   Objective:   Vitals:   02/28/19 0834  BP: 116/71  Pulse: 88  Temp: 98.4 F (36.9 C)  Weight: 175 lb (79.4 kg)    Fetal Status: Fetal Heart Rate (bpm): 145 Fundal Height: 28 cm Movement: Present     General:  Alert, oriented and cooperative. Patient is in no acute distress.  Skin: Skin is warm and dry. No rash noted.   Cardiovascular: Normal heart rate noted  Respiratory: Normal respiratory effort, no problems with respiration noted  Abdomen: Soft, gravid, appropriate for gestational age.  Pain/Pressure: Present     Pelvic: Cervical exam deferred        Extremities: Normal range of motion.  Edema: Trace  Mental Status: Normal mood and affect. Normal behavior. Normal judgment and thought content.   Assessment and Plan:  Pregnancy: G1P0 at [redacted]w[redacted]d 1. Encounter for supervision of low-risk pregnancy in first trimester - Doing well - CBC, HIV, RPR and 2 hour GTT today  - Tdap vaccine greater than or equal to 7yo IM - Patient taking gummy PNV, may need iron supplement vs. fereheme depending on CBC results today  2. GBS bacteriuria - Culture, OB Urine - repeat  3. 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 Please refer to After Visit Summary for other counseling recommendations.   Return in about 4 weeks (around 03/28/2019) for LOB, Virtual.  Future Appointments  Date Time Provider [place]  02/28/2019 10:00 AM WOC-[facility]OC[facility] LAB WOC-[facility]A WOC    Kerry Hough, [place]

## 2019-03-01 LAB — CBC
Hematocrit: 29.3 % — ABNORMAL LOW (ref 34.0–46.6)
Hemoglobin: 9.7 g/dL — ABNORMAL LOW (ref 11.1–15.9)
MCH: 27.9 pg (ref 26.6–33.0)
MCHC: 33.1 g/dL (ref 31.5–35.7)
MCV: 84 fL (ref 79–97)
Platelets: 184 10*3/uL (ref 150–450)
RBC: 3.48 x10E6/uL — ABNORMAL LOW (ref 3.77–5.28)
RDW: 12.9 % (ref 11.7–15.4)
WBC: 9.2 10*3/uL (ref 3.4–10.8)

## 2019-03-01 LAB — GLUCOSE TOLERANCE, 2 HOURS W/ 1HR
Glucose, 1 hour: 170 mg/dL (ref 65–179)
Glucose, 2 hour: 182 mg/dL — ABNORMAL HIGH (ref 65–152)
Glucose, Fasting: 92 mg/dL — ABNORMAL HIGH (ref 65–91)

## 2019-03-01 LAB — HIV ANTIBODY (ROUTINE TESTING W REFLEX): HIV Screen 4th Generation wRfx: NONREACTIVE

## 2019-03-01 LAB — RPR: RPR Ser Ql: NONREACTIVE

## 2019-03-01 MED ORDER — FERROUS SULFATE 325 (65 FE) MG PO TABS: 325.0000 mg | ORAL_TABLET | Freq: Every day | ORAL | 11 refills | Status: DC

## 2019-03-02 ENCOUNTER — Telehealth: Payer: Self-pay

## 2019-03-02 NOTE — Telephone Encounter (Signed)
Called pt to make aware that she failed Glucose test & that she was showing to be a little anemic, advised someone will be calling her to schedule appoint with Bev in Nutrition & Diabetes and for her to pick up her Rx for Iron. Pt verbalized understanding.

## 2019-03-02 NOTE — Telephone Encounter (Signed)
-----   Message from Luvenia Redden, PA-C sent at 03/01/2019  1:21 PM EDT ----- Patient failed 2 hour GTT and is mildly anemic. Please set up with supplies and Diabetes Ed appointment. I have sent Rx for iron.   Luvenia Redden, PA-C 03/01/2019 1:20 PM

## 2019-03-13 ENCOUNTER — Encounter: Payer: Self-pay | Admitting: General Practice

## 2019-03-14 ENCOUNTER — Other Ambulatory Visit: Payer: Self-pay

## 2019-03-14 ENCOUNTER — Other Ambulatory Visit: Payer: Self-pay | Admitting: Lactation Services

## 2019-03-14 ENCOUNTER — Ambulatory Visit: Payer: 59 | Admitting: *Deleted

## 2019-03-14 ENCOUNTER — Encounter: Payer: 59 | Attending: Family Medicine | Admitting: *Deleted

## 2019-03-14 DIAGNOSIS — Z3A00 Weeks of gestation of pregnancy not specified: Secondary | ICD-10-CM | POA: Diagnosis not present

## 2019-03-14 DIAGNOSIS — O24419 Gestational diabetes mellitus in pregnancy, unspecified control: Secondary | ICD-10-CM | POA: Insufficient documentation

## 2019-03-14 MED ORDER — ACCU-CHEK GUIDE VI STRP: ORAL_STRIP | 12 refills | Status: DC

## 2019-03-14 MED ORDER — ACCU-CHEK GUIDE W/DEVICE KIT: 1.0000 | PACK | Freq: Once | 0 refills | Status: AC

## 2019-03-14 MED ORDER — ACCU-CHEK FASTCLIX LANCETS MISC: 1.0000 | Freq: Four times a day (QID) | 12 refills | Status: DC

## 2019-03-14 NOTE — Progress Notes (Signed)
  Patient was seen on 03/14/2019 for Gestational Diabetes self-management. EDD 05/20/2019. Patient states no history of GDM. Diet history obtained. Patient eats good variety of all food groups. Beverages include water, diet soda, occasional fruit juice and sweet tea.  The following learning objectives were met by the patient :   States the definition of Gestational Diabetes  States why dietary management is important in controlling blood glucose  Describes the effects of carbohydrates on blood glucose levels  Demonstrates ability to create a balanced meal plan  Demonstrates carbohydrate counting   States when to check blood glucose levels  Demonstrates proper blood glucose monitoring techniques  States the effect of stress and exercise on blood glucose levels  States the importance of limiting caffeine and abstaining from alcohol and smoking  Plan:  Aim for 3 Carb Choices per meal (45 grams) +/- 1 either way  Aim for 1-2 Carbs per snack Begin reading food labels for Total Carbohydrate of foods If OK with your MD, consider  increasing your activity level by walking, Arm Chair Exercises or other activity daily as tolerated Begin checking BG before breakfast and 2 hours after first bite of breakfast, lunch and dinner as directed by MD  Bring Log Book/Sheet and meter to every medical appointment OR use Baby Scripts (see below) Baby Scripts:  Patient was introduced to [employer] and plans to use as record of BG electronically  Take medication if directed by MD  Blood glucose monitor Rx called into pharmacy: Accu Check Guide with Fast Clix drums Patient instructed to test pre breakfast and 2 hours each meal as directed by MD  Patient instructed to monitor glucose levels: FBS: 60 - 95 mg/dl 2 hour: <120 mg/dl  Patient received the following handouts:  Nutrition Diabetes and Pregnancy  Carbohydrate Counting List  BG Log Sheet  Patient will be seen for follow-up as needed.

## 2019-03-14 NOTE — Progress Notes (Signed)
Glucometer, lancets, and Strips order sent to pharmacy per Jeanie Sewer, CDE

## 2019-03-27 ENCOUNTER — Telehealth: Payer: Self-pay | Admitting: Student

## 2019-03-27 NOTE — Telephone Encounter (Signed)
Called the patient to inform of the upcoming visit and informed of our new location. Completed the covid19 screening for the patient, the patient answered no to all questions. Also informed of no children or visitors due to the covid19 restriction. Also informed of wearing a face mask upon entry. The patient verbalized understanding. °

## 2019-03-28 ENCOUNTER — Telehealth: Payer: 59 | Admitting: Medical

## 2019-03-28 ENCOUNTER — Ambulatory Visit (INDEPENDENT_AMBULATORY_CARE_PROVIDER_SITE_OTHER): Payer: 59 | Admitting: Obstetrics & Gynecology

## 2019-03-28 ENCOUNTER — Other Ambulatory Visit: Payer: Self-pay

## 2019-03-28 VITALS — HR 103 | Wt 179.1 lb

## 2019-03-28 DIAGNOSIS — O24419 Gestational diabetes mellitus in pregnancy, unspecified control: Secondary | ICD-10-CM

## 2019-03-28 DIAGNOSIS — Z3493 Encounter for supervision of normal pregnancy, unspecified, third trimester: Secondary | ICD-10-CM

## 2019-03-28 DIAGNOSIS — R8271 Bacteriuria: Secondary | ICD-10-CM

## 2019-03-28 DIAGNOSIS — Z3A32 32 weeks gestation of pregnancy: Secondary | ICD-10-CM

## 2019-03-28 DIAGNOSIS — O99820 Streptococcus B carrier state complicating pregnancy: Secondary | ICD-10-CM

## 2019-03-28 DIAGNOSIS — B009 Herpesviral infection, unspecified: Secondary | ICD-10-CM

## 2019-03-28 NOTE — Progress Notes (Signed)
   PRENATAL VISIT NOTE  Subjective:  Sierra Wu is a 23 y.o. G1P0 at [redacted]w[redacted]d being seen today for ongoing prenatal care.  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  Patient reports no complaints.  Contractions: Not present. Vag. Bleeding: None.  Movement: Present. Denies leaking of fluid.   The following portions of the patient's history were reviewed and updated as appropriate: allergies, current medications, past family history, past medical history, past social history, past surgical history and problem list.   Objective:   Vitals:   03/28/19 1634  Pulse: (!) 103  Weight: 179 lb 1.6 oz (81.2 kg)    Fetal Status: Fetal Heart Rate (bpm): 132   Movement: Present     General:  Alert, oriented and cooperative. Patient is in no acute distress.  Skin: Skin is warm and dry. No rash noted.   Cardiovascular: Normal heart rate noted  Respiratory: Normal respiratory effort, no problems with respiration noted  Abdomen: Soft, gravid, appropriate for gestational age.  Pain/Pressure: Present     Pelvic: Cervical exam deferred        Extremities: Normal range of motion.  Edema: Trace  Mental Status: Normal mood and affect. Normal behavior. Normal judgment and thought content.   Assessment and Plan:  Pregnancy: G1P0 at [redacted]w[redacted]d 1. Gestational diabetes mellitus (GDM) in third trimester, gestational diabetes method of control unspecified - excellent sugars on no meds - Hemoglobin A1c - [place] MFM OB FOLLOW UP; Future  2. Encounter for supervision of low-risk pregnancy in third trimester   3. GBS bacteriuria - treat in labor  4. HSV-2 infection - treat starting at 35 weeks  Preterm labor symptoms and general obstetric precautions including but not limited to vaginal bleeding, contractions, leaking of fluid and fetal movement were reviewed in detail with  the patient. Please refer to After Visit Summary for other counseling recommendations.   Return in about 2 weeks (around 04/11/2019) for virtual.  No future appointments.  Emily Filbert, MD

## 2019-03-29 LAB — HEMOGLOBIN A1C
Est. average glucose Bld gHb Est-mCnc: 114 mg/dL
Hgb A1c MFr Bld: 5.6 % (ref 4.8–5.6)

## 2019-04-03 ENCOUNTER — Encounter: Payer: Self-pay | Admitting: *Deleted

## 2019-04-16 ENCOUNTER — Telehealth: Payer: 59 | Admitting: Family Medicine

## 2019-04-16 ENCOUNTER — Other Ambulatory Visit: Payer: Self-pay

## 2019-04-20 ENCOUNTER — Encounter: Payer: Self-pay | Admitting: Obstetrics and Gynecology

## 2019-04-20 ENCOUNTER — Other Ambulatory Visit: Payer: Self-pay

## 2019-04-20 ENCOUNTER — Telehealth (INDEPENDENT_AMBULATORY_CARE_PROVIDER_SITE_OTHER): Payer: 59 | Admitting: Obstetrics and Gynecology

## 2019-04-20 VITALS — BP 117/88 | HR 93

## 2019-04-20 DIAGNOSIS — O24419 Gestational diabetes mellitus in pregnancy, unspecified control: Secondary | ICD-10-CM | POA: Insufficient documentation

## 2019-04-20 DIAGNOSIS — Z3A35 35 weeks gestation of pregnancy: Secondary | ICD-10-CM

## 2019-04-20 DIAGNOSIS — R8271 Bacteriuria: Secondary | ICD-10-CM

## 2019-04-20 DIAGNOSIS — B009 Herpesviral infection, unspecified: Secondary | ICD-10-CM

## 2019-04-20 DIAGNOSIS — Z3493 Encounter for supervision of normal pregnancy, unspecified, third trimester: Secondary | ICD-10-CM

## 2019-04-20 DIAGNOSIS — O24410 Gestational diabetes mellitus in pregnancy, diet controlled: Secondary | ICD-10-CM

## 2019-04-20 MED ORDER — VALACYCLOVIR HCL 500 MG PO TABS: 500.0000 mg | ORAL_TABLET | Freq: Two times a day (BID) | ORAL | 6 refills | Status: DC

## 2019-04-20 NOTE — Progress Notes (Signed)
.  cwhte 

## 2019-04-20 NOTE — Progress Notes (Signed)
I connected with  Sierra Wu on 04/20/19 at  9:15 AM EDT by telephone and verified that I am speaking with the correct person using two identifiers.   I discussed the limitations, risks, security and privacy concerns of performing an evaluation and management service by telephone and the availability of in person appointments. I also discussed with the patient that there may be a patient responsible charge related to this service. The patient expressed understanding and agreed to proceed.  Bethanne Ginger, CMA 04/20/2019  9:29 AM

## 2019-04-20 NOTE — Progress Notes (Signed)
Pt states when she lays down she has an uncomfortable feeling in her left breast.

## 2019-04-20 NOTE — Progress Notes (Signed)
   TELEHEALTH OBSTETRICS PRENATAL VIRTUAL VIDEO VISIT ENCOUNTER NOTE  Provider location: [facility] [place]   I connected with Sierra Wu on 04/20/19 at  9:15 AM EDT by MyChart Video Encounter at home and verified that I am speaking with the correct person using two identifiers.   I discussed the limitations, risks, security and privacy concerns of performing an evaluation and management service virtually and the availability of in person appointments. I also discussed with the patient that there may be a patient responsible charge related to this service. The patient expressed understanding and agreed to proceed. Subjective:  Sierra Wu is a 23 y.o. G1P0 at [redacted]w[redacted]d being seen today for ongoing prenatal care.  She is currently monitored for the following issues for this high-risk pregnancy and has Supervision of low-risk pregnancy; GBS bacteriuria; HSV-2 infection; and Gestational diabetes on their problem list.  Patient reports pain in left breast.  Contractions: Not present. Vag. Bleeding: None.   . Denies any leaking of fluid.   The following portions of the patient's history were reviewed and updated as appropriate: allergies, current medications, past family history, past medical history, past social history, past surgical history and problem list.   Objective:   Vitals:   04/20/19 0926  BP: 117/88  Pulse: 93    Fetal Status:           General:  Alert, oriented and cooperative. Patient is in no acute distress.  Respiratory: Normal respiratory effort, no problems with respiration noted  Mental Status: Normal mood and affect. Normal behavior. Normal judgment and thought content.  Rest of physical exam deferred due to type of encounter  Imaging: No results found.  Assessment and Plan:  Pregnancy: G1P0 at [redacted]w[redacted]d  1. GBS bacteriuria ppx in labor  2. Encounter for supervision of low-risk pregnancy in third trimester Reviewed induction @ 40 weeks  3.  HSV-2 infection Cont valtrex BID No symptoms  4. Diet controlled gestational diabetes mellitus (GDM) in third trimester Diet controlled FG: 80-90 PP: 90-115 Excellent control of CBG Has f/u US on 05/09/19   Preterm labor symptoms and general obstetric precautions including but not limited to vaginal bleeding, contractions, leaking of fluid and fetal movement were reviewed in detail with the patient. I discussed the assessment and treatment plan with the patient. The patient was provided an opportunity to ask questions and all were answered. The patient agreed with the plan and demonstrated an understanding of the instructions. The patient was advised to call back or seek an in-person office evaluation/go to MAU at [facility] for any urgent or concerning symptoms. Please refer to After Visit Summary for other counseling recommendations.   I provided 15 minutes of face-to-face time during this encounter.  Return in about 1 week (around 04/27/2019) for in person, high OB.  Future Appointments  Date Time Provider [place]  05/01/2019  9:55 AM Lavonia Drafts, MD WOC-WOCA WOC  05/09/2019  3:30 PM [place] 1 WH-MFCUS MFC-US  05/14/2019  3:35 PM Truett Mainland, DO WOC-WOCA [place]  05/21/2019  1:15 PM Sloan Leiter, MD WOC-WOCA [place]  05/28/2019  1:55 PM Truett Mainland, DO WOC-WOCA [place]  06/04/2019  1:15 PM Nehemiah Settle, Tanna Savoy, DO WOC-WOCA [place]  06/11/2019  1:15 PM Truett Mainland, DO [place], [place] [facility], [place]

## 2019-04-30 ENCOUNTER — Telehealth: Payer: 59 | Admitting: Family Medicine

## 2019-05-01 ENCOUNTER — Telehealth (INDEPENDENT_AMBULATORY_CARE_PROVIDER_SITE_OTHER): Payer: 59 | Admitting: Obstetrics & Gynecology

## 2019-05-01 ENCOUNTER — Other Ambulatory Visit: Payer: Self-pay

## 2019-05-01 VITALS — BP 126/88 | HR 93

## 2019-05-01 DIAGNOSIS — O0990 Supervision of high risk pregnancy, unspecified, unspecified trimester: Secondary | ICD-10-CM

## 2019-05-01 DIAGNOSIS — O98513 Other viral diseases complicating pregnancy, third trimester: Secondary | ICD-10-CM

## 2019-05-01 DIAGNOSIS — B009 Herpesviral infection, unspecified: Secondary | ICD-10-CM

## 2019-05-01 DIAGNOSIS — O24410 Gestational diabetes mellitus in pregnancy, diet controlled: Secondary | ICD-10-CM

## 2019-05-01 DIAGNOSIS — R8271 Bacteriuria: Secondary | ICD-10-CM

## 2019-05-01 DIAGNOSIS — O0993 Supervision of high risk pregnancy, unspecified, third trimester: Secondary | ICD-10-CM

## 2019-05-01 DIAGNOSIS — Z3A37 37 weeks gestation of pregnancy: Secondary | ICD-10-CM

## 2019-05-01 NOTE — Progress Notes (Signed)
   TELEHEALTH OBSTETRICS PRENATAL VIRTUAL VIDEO VISIT ENCOUNTER NOTE  Provider location: [facility] [place]   I connected with Sierra Wu on 05/01/19 at  9:55 AM EDT by MyChart Video Encounter at home and verified that I am speaking with the correct person using two identifiers.   I discussed the limitations, risks, security and privacy concerns of performing an evaluation and management service virtually and the availability of in person appointments. I also discussed with the patient that there may be a patient responsible charge related to this service. The patient expressed understanding and agreed to proceed. Subjective:  Sierra Wu is a 23 y.o. G1P0 at [redacted]w[redacted]d being seen today for ongoing prenatal care.  She is currently monitored for the following issues for this high-risk pregnancy and has Supervision of high risk pregnancy, antepartum; GBS bacteriuria; HSV-2 infection; and Gestational diabetes on their problem list.  Patient reports increaseed pressure. .  Contractions: Not present. Vag. Bleeding: None.  Movement: Present. Denies any leaking of fluid.   The following portions of the patient's history were reviewed and updated as appropriate: allergies, current medications, past family history, past medical history, past social history, past surgical history and problem list.   Objective:   Vitals:   05/01/19 0958  BP: 126/88  Pulse: 93    Fetal Status:     Movement: Present     General:  Alert, oriented and cooperative. Patient is in no acute distress.  Respiratory: Normal respiratory effort, no problems with respiration noted  Mental Status: Normal mood and affect. Normal behavior. Normal judgment and thought content.  Rest of physical exam deferred due to type of encounter  Imaging: No results found.  Assessment and Plan:  Pregnancy: G1P0 at [redacted]w[redacted]d 1. GBS bacteriuria Needs atbx in labor.   2. Supervision of high risk pregnancy, antepartum   3. Diet controlled gestational diabetes mellitus (GDM) in third trimester Fasting 86-90. Not all levels are recorded. Pt reports that she has been logging glc.  The 2 hour PP are all ok     Preterm labor symptoms and general obstetric precautions including but not limited to vaginal bleeding, contractions, leaking of fluid and fetal movement were reviewed in detail with the patient. I discussed the assessment and treatment plan with the patient. The patient was provided an opportunity to ask questions and all were answered. The patient agreed with the plan and demonstrated an understanding of the instructions. The patient was advised to call back or seek an in-person office evaluation/go to MAU at [facility] for any urgent or concerning symptoms. Please refer to After Visit Summary for other counseling recommendations.   I provided 9 minutes of face-to-face time during this encounter.  No follow-ups on file.  Future Appointments  Date Time Provider [place]  05/09/2019  3:30 PM [place] 1 WH-MFCUS MFC-US  05/14/2019  3:35 PM Truett Mainland, DO WOC-WOCA [place]  05/21/2019  1:15 PM Sloan Leiter, MD WOC-WOCA [place]  05/28/2019  1:55 PM Truett Mainland, DO WOC-WOCA [place]  06/04/2019  1:15 PM Nehemiah Settle, Tanna Savoy, DO WOC-WOCA [place]  06/11/2019  1:15 PM Nehemiah Settle, Tanna Savoy, DO WOC-WOCA WOC    Lavonia Drafts, MD [facility], [facility]

## 2019-05-01 NOTE — Progress Notes (Signed)
I connected with  [name] on 05/01/19 at  9:55 AM EDT by telephone and verified that I am speaking with the correct person using two identifiers.   I discussed the limitations, risks, security and privacy concerns of performing an evaluation and management service by telephone and the availability of in person appointments. I also discussed with the patient that there may be a patient responsible charge related to this service. The patient expressed understanding and agreed to proceed.  Derinda Late, RN 05/01/2019  9:55 AM

## 2019-05-03 ENCOUNTER — Encounter: Payer: Self-pay | Admitting: *Deleted

## 2019-05-06 ENCOUNTER — Encounter (HOSPITAL_COMMUNITY): Payer: Self-pay | Admitting: *Deleted

## 2019-05-06 ENCOUNTER — Inpatient Hospital Stay (EMERGENCY_DEPARTMENT_HOSPITAL)
Admission: AD | Admit: 2019-05-06 | Discharge: 2019-05-06 | Disposition: A | Discharge status: Home / Self Care | Payer: 59 | Source: Home / Self Care | Attending: Family Medicine | Admitting: Family Medicine

## 2019-05-06 ENCOUNTER — Inpatient Hospital Stay (HOSPITAL_BASED_OUTPATIENT_CLINIC_OR_DEPARTMENT_OTHER): Payer: 59

## 2019-05-06 ENCOUNTER — Other Ambulatory Visit: Payer: Self-pay

## 2019-05-06 DIAGNOSIS — O403XX0 Polyhydramnios, third trimester, not applicable or unspecified: Secondary | ICD-10-CM | POA: Diagnosis present

## 2019-05-06 DIAGNOSIS — R8271 Bacteriuria: Secondary | ICD-10-CM

## 2019-05-06 DIAGNOSIS — O0990 Supervision of high risk pregnancy, unspecified, unspecified trimester: Secondary | ICD-10-CM

## 2019-05-06 DIAGNOSIS — O24410 Gestational diabetes mellitus in pregnancy, diet controlled: Secondary | ICD-10-CM | POA: Insufficient documentation

## 2019-05-06 DIAGNOSIS — Z3A38 38 weeks gestation of pregnancy: Secondary | ICD-10-CM | POA: Insufficient documentation

## 2019-05-06 DIAGNOSIS — O368130 Decreased fetal movements, third trimester, not applicable or unspecified: Secondary | ICD-10-CM | POA: Diagnosis not present

## 2019-05-06 NOTE — MAU Provider Note (Signed)
No chief complaint on file.    First Provider Initiated Contact with Patient 05/06/19 1432      S: Sierra Wu  is a 23 y.o. y.o. year old G62P0 female at [redacted]w[redacted]d weeks gestation who presents to MAU reporting decreased fetal movement x 1 week. Only three mvmts in the past three days.   Contractions: denies Vaginal bleeding: Denies Leaking of fluid: Denies  Patient Active Problem List   Diagnosis Date Noted  . Gestational diabetes 04/20/2019  . 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 GBS bacteriuria 11/13/2018  . Supervision of high risk pregnancy, antepartum 10/24/2018    O:  Patient Vitals for the past 24 hrs:  BP Temp Temp src Pulse Resp SpO2 Height Weight  05/06/19 1344 118/78 98.1 F (36.7 C) Oral 95 16 100 % - -  05/06/19 1341 - - - - - - 5\' 4"  (1.626 m) 88.3 kg   General: NAD Heart: Regular rate Lungs: Normal rate and effort Abd: Soft, NT, Gravid, S=D Pelvic: NEFG, no blood.     NST performed EFM: 135, mod variability, 15x15 accels, no decels Toco: Rare, mild  MAU Course/MDM - reactive NST, but pt still not perceiving fetal mvmt. BPP Ordered. - BPP 10/10, but Polyhydramnios noted. Has F/U US on 05/09/19. Plan IOL at 39 weeks. Poly precautions.  A: [redacted]w[redacted]d week IUP Decreased fetal movement with reactive NST. Poluhydramnios  P: Discharge home in stable condition. Labor precautions and fetal kick counts. Follow-up as scheduled for prenatal visit or sooner as needed if symptoms worsen. Return to maternity admissions as needed if symptoms worsen.  Tamala Julian, [place], [place] 05/06/2019 5:26 PM  2

## 2019-05-06 NOTE — MAU Note (Signed)
Sierra Wu is a 23 y.o. at [redacted]w[redacted]d here in MAU reporting: decreased fetal movement since yesterday. Felt one movement yesterday and only 2 movements today. No pain, bleeding, or LOF.  Onset of complaint: yesterday  Pain score: 0/10  Vitals:   05/06/19 1344  BP: 118/78  Pulse: 95  Resp: 16  Temp: 98.1 F (36.7 C)  SpO2: 100%     FHT: 136  Lab orders placed from triage: none

## 2019-05-06 NOTE — Discharge Instructions (Signed)
Polyhydramnios When a woman becomes pregnant, a sac forms around her growing baby. This sac, called the amniotic sac, is filled with fluid (amniotic fluid) that:  Cushions and protects the baby.  Helps the babys lungs and gastrointestinal tract grow.  Allows the baby to move around, which helps the babys muscles and bones develop. Polyhydramnios means that there is too much fluid in the sac. Babies born with polyhydramnios should be checked for birth defects. What are the causes? This condition may be caused by:  Diabetes in the mother.  The baby being larger than normal for the baby's age (large for gestational age).  Problems that prevent the fetus from swallowing amniotic fluid. These may include: ? An abnormal chromosome. ? Abnormality in the baby's intestinal tract. ? A birth defect in which the baby does not have a brain or parts of the brain (anencephaly).  A condition that affects twins, called twin-twin transfusion syndrome.  An illness in the mother, such as kidney or heart disease.  A tumor of the placenta (chorioangioma).  An infection in the baby. What are the signs or symptoms? Symptoms of this condition include:  A uterus that is larger than it should be for the stage of pregnancy.  Shortness of breath.  Increased feeling of pressure on the abdomen.  Increased swelling in the legs.  Preterm labor.  Preeclampsia. How is this diagnosed? This condition may be diagnosed based on:  A measurement of your abdomen. Your health care provider can diagnose the condition if he or she measures you and notices that your uterus is larger than it should be.  An ultrasound. This image may be taken by placing a device on your abdomen or into your vagina. The test can measure the amount of fluid in the amniotic sac (amniotic fluid index). It can also: ? Show if you are carrying twins or more. ? Measure the growth of the baby. ? Look for birth defects. How is this  treated? This condition is treated by removing fluid from the amniotic sac. This is done through a procedure where a needle is inserted through the skin into the uterus (amniocentesis). Follow these instructions at home:  Make sure to keep any medical conditions you have under control. If you have diabetes, talk to your health care provider about ways to manage your blood sugar.  Keep all your prenatal visits as told by your health care provider. It is important to follow your health care providers recommendations. Contact a health care provider if:  You think your uterus has grown too fast in a short period of time.  You feel a great amount of pressure in your pelvis and are more uncomfortable than expected. Get help right away if:  You have a gush of fluid or are leaking fluid from your vagina.  You stop feeling the baby move.  You do not feel the baby kicking as much as usual.  You have diabetes and you have a hard time keeping it under control.  You have kidney or heart disease and it is causing you problems. Summary  Polyhydramnios means there is too much fluid in the amniotic sac. This can lead to birth defects.  This condition may be diagnosed based on a measurement of your abdomen or an ultrasound.  Keep all your prenatal visits as told by your health care provider. It is important to follow your health care providers recommendations. This information is not intended to replace advice given to you by your health  care provider. Make sure you discuss any questions you have with your health care provider. Document Released: 09/25/2002 Document Revised: 06/17/2017 Document Reviewed: 09/21/2016 Elsevier Patient Education  2020 Elsevier Inc.  

## 2019-05-07 ENCOUNTER — Telehealth: Payer: Self-pay | Admitting: *Deleted

## 2019-05-07 ENCOUNTER — Other Ambulatory Visit (HOSPITAL_COMMUNITY): Payer: Self-pay | Admitting: Advanced Practice Midwife

## 2019-05-07 ENCOUNTER — Encounter: Payer: Self-pay | Admitting: *Deleted

## 2019-05-07 DIAGNOSIS — O403XX0 Polyhydramnios, third trimester, not applicable or unspecified: Secondary | ICD-10-CM | POA: Diagnosis present

## 2019-05-07 NOTE — Telephone Encounter (Signed)
-----   Message from [place], [place] sent at 05/06/2019 10:31 PM EDT ----- Regarding: Needs IOL Please schedule pt for IOL 05/13/19 for Polyhydramnios. Plan. Cytotec.

## 2019-05-07 NOTE — Telephone Encounter (Signed)
I scheduled IOL for 05/13/19 0000 and called Kind[place] and informed her to report to [facility] at 11;45 pm on 05/12/19 for IOL. I also advised her per L&D she needs to get covid tested 3 days before at [facility] and needs to quarantine. She voices understanding.  Aleina Burgio,RN

## 2019-05-08 ENCOUNTER — Encounter (HOSPITAL_COMMUNITY): Payer: Self-pay | Admitting: *Deleted

## 2019-05-08 ENCOUNTER — Inpatient Hospital Stay (HOSPITAL_COMMUNITY)
Admission: AD | Admit: 2019-05-08 | Discharge: 2019-05-10 | DRG: 806 | Disposition: A | Discharge status: Home / Self Care | Payer: 59 | Attending: Family Medicine | Admitting: Family Medicine

## 2019-05-08 ENCOUNTER — Other Ambulatory Visit: Payer: Self-pay

## 2019-05-08 ENCOUNTER — Telehealth (HOSPITAL_COMMUNITY): Payer: Self-pay | Admitting: *Deleted

## 2019-05-08 ENCOUNTER — Encounter (HOSPITAL_COMMUNITY): Payer: Self-pay

## 2019-05-08 DIAGNOSIS — Z3A38 38 weeks gestation of pregnancy: Secondary | ICD-10-CM

## 2019-05-08 DIAGNOSIS — R8271 Bacteriuria: Secondary | ICD-10-CM | POA: Diagnosis present

## 2019-05-08 DIAGNOSIS — O403XX0 Polyhydramnios, third trimester, not applicable or unspecified: Secondary | ICD-10-CM | POA: Diagnosis present

## 2019-05-08 DIAGNOSIS — O24420 Gestational diabetes mellitus in childbirth, diet controlled: Secondary | ICD-10-CM | POA: Diagnosis present

## 2019-05-08 DIAGNOSIS — O9832 Other infections with a predominantly sexual mode of transmission complicating childbirth: Secondary | ICD-10-CM | POA: Diagnosis present

## 2019-05-08 DIAGNOSIS — O24410 Gestational diabetes mellitus in pregnancy, diet controlled: Secondary | ICD-10-CM | POA: Diagnosis not present

## 2019-05-08 DIAGNOSIS — O99824 Streptococcus B carrier state complicating childbirth: Secondary | ICD-10-CM | POA: Diagnosis present

## 2019-05-08 DIAGNOSIS — O4202 Full-term premature rupture of membranes, onset of labor within 24 hours of rupture: Secondary | ICD-10-CM | POA: Diagnosis not present

## 2019-05-08 DIAGNOSIS — O4292 Full-term premature rupture of membranes, unspecified as to length of time between rupture and onset of labor: Secondary | ICD-10-CM | POA: Diagnosis present

## 2019-05-08 DIAGNOSIS — O24419 Gestational diabetes mellitus in pregnancy, unspecified control: Secondary | ICD-10-CM | POA: Diagnosis present

## 2019-05-08 DIAGNOSIS — A6000 Herpesviral infection of urogenital system, unspecified: Secondary | ICD-10-CM | POA: Diagnosis present

## 2019-05-08 DIAGNOSIS — O4290 Premature rupture of membranes, unspecified as to length of time between rupture and onset of labor, unspecified weeks of gestation: Secondary | ICD-10-CM | POA: Diagnosis present

## 2019-05-08 DIAGNOSIS — O26893 Other specified pregnancy related conditions, third trimester: Secondary | ICD-10-CM | POA: Diagnosis present

## 2019-05-08 DIAGNOSIS — O0990 Supervision of high risk pregnancy, unspecified, unspecified trimester: Secondary | ICD-10-CM

## 2019-05-08 DIAGNOSIS — Z20828 Contact with and (suspected) exposure to other viral communicable diseases: Secondary | ICD-10-CM | POA: Diagnosis present

## 2019-05-08 DIAGNOSIS — B009 Herpesviral infection, unspecified: Secondary | ICD-10-CM | POA: Diagnosis present

## 2019-05-08 LAB — CBC
HCT: 34 % — ABNORMAL LOW (ref 36.0–46.0)
Hemoglobin: 11 g/dL — ABNORMAL LOW (ref 12.0–15.0)
MCH: 28 pg (ref 26.0–34.0)
MCHC: 32.4 g/dL (ref 30.0–36.0)
MCV: 86.5 fL (ref 80.0–100.0)
Platelets: 183 10*3/uL (ref 150–400)
RBC: 3.93 MIL/uL (ref 3.87–5.11)
RDW: 16 % — ABNORMAL HIGH (ref 11.5–15.5)
WBC: 7.1 10*3/uL (ref 4.0–10.5)
nRBC: 0 % (ref 0.0–0.2)

## 2019-05-08 LAB — ABO/RH: ABO/RH(D): O POS

## 2019-05-08 LAB — GLUCOSE, CAPILLARY
Glucose-Capillary: 66 mg/dL — ABNORMAL LOW (ref 70–99)
Glucose-Capillary: 94 mg/dL (ref 70–99)

## 2019-05-08 LAB — TYPE AND SCREEN
ABO/RH(D): O POS
Antibody Screen: NEGATIVE

## 2019-05-08 LAB — POCT FERN TEST: POCT Fern Test: POSITIVE

## 2019-05-08 LAB — SARS CORONAVIRUS 2 BY RT PCR (HOSPITAL ORDER, PERFORMED IN ~~LOC~~ HOSPITAL LAB): SARS Coronavirus 2: NEGATIVE

## 2019-05-08 MED ORDER — FLEET ENEMA 7-19 GM/118ML RE ENEM: 1.0000 | ENEMA | RECTAL | Status: DC | PRN

## 2019-05-08 MED ORDER — PENICILLIN G 3 MILLION UNITS IVPB - SIMPLE MED
3.0000 10*6.[IU] | INTRAVENOUS | Status: DC
  Administered 2019-05-09 (×3): 3 10*6.[IU] via INTRAVENOUS
  Filled 2019-05-08 (×4): qty 100

## 2019-05-08 MED ORDER — FENTANYL CITRATE (PF) 100 MCG/2ML IJ SOLN
50.0000 ug | INTRAMUSCULAR | Status: DC | PRN
  Administered 2019-05-09: 100 ug via INTRAVENOUS
  Administered 2019-05-09: 50 ug via INTRAVENOUS
  Filled 2019-05-08 (×2): qty 2

## 2019-05-08 MED ORDER — OXYTOCIN 40 UNITS IN NORMAL SALINE INFUSION - SIMPLE MED
2.5000 [IU]/h | INTRAVENOUS | Status: DC
  Filled 2019-05-08: qty 1000

## 2019-05-08 MED ORDER — OXYTOCIN BOLUS FROM INFUSION
500.0000 mL | Freq: Once | INTRAVENOUS | Status: AC
  Administered 2019-05-09: 500 mL via INTRAVENOUS

## 2019-05-08 MED ORDER — ONDANSETRON HCL 4 MG/2ML IJ SOLN: 4.0000 mg | Freq: Four times a day (QID) | INTRAMUSCULAR | Status: DC | PRN

## 2019-05-08 MED ORDER — OXYCODONE-ACETAMINOPHEN 5-325 MG PO TABS: 1.0000 | ORAL_TABLET | ORAL | Status: DC | PRN

## 2019-05-08 MED ORDER — LACTATED RINGERS IV SOLN
INTRAVENOUS | Status: DC
  Administered 2019-05-08 – 2019-05-09 (×4): via INTRAVENOUS

## 2019-05-08 MED ORDER — SODIUM CHLORIDE 0.9 % IV SOLN
5.0000 10*6.[IU] | Freq: Once | INTRAVENOUS | Status: AC
  Administered 2019-05-08: 5 10*6.[IU] via INTRAVENOUS
  Filled 2019-05-08: qty 5

## 2019-05-08 MED ORDER — TERBUTALINE SULFATE 1 MG/ML IJ SOLN
0.2500 mg | Freq: Once | INTRAMUSCULAR | Status: DC | PRN
  Filled 2019-05-08: qty 1

## 2019-05-08 MED ORDER — OXYCODONE-ACETAMINOPHEN 5-325 MG PO TABS: 2.0000 | ORAL_TABLET | ORAL | Status: DC | PRN

## 2019-05-08 MED ORDER — MISOPROSTOL 50MCG HALF TABLET
50.0000 ug | ORAL_TABLET | ORAL | Status: DC | PRN
  Administered 2019-05-09: 50 ug via ORAL
  Filled 2019-05-08: qty 1

## 2019-05-08 MED ORDER — SOD CITRATE-CITRIC ACID 500-334 MG/5ML PO SOLN: 30.0000 mL | ORAL | Status: DC | PRN

## 2019-05-08 MED ORDER — LACTATED RINGERS IV SOLN
500.0000 mL | INTRAVENOUS | Status: DC | PRN
  Administered 2019-05-08: 1000 mL via INTRAVENOUS

## 2019-05-08 MED ORDER — LIDOCAINE HCL (PF) 1 % IJ SOLN: 30.0000 mL | INTRAMUSCULAR | Status: DC | PRN

## 2019-05-08 MED ORDER — ACETAMINOPHEN 325 MG PO TABS: 650.0000 mg | ORAL_TABLET | ORAL | Status: DC | PRN

## 2019-05-08 MED ORDER — HYDROXYZINE HCL 50 MG PO TABS
50.0000 mg | ORAL_TABLET | Freq: Four times a day (QID) | ORAL | Status: DC | PRN
  Filled 2019-05-08: qty 1

## 2019-05-08 MED ORDER — VALACYCLOVIR HCL 500 MG PO TABS
500.0000 mg | ORAL_TABLET | Freq: Two times a day (BID) | ORAL | Status: DC
  Administered 2019-05-09 (×2): 500 mg via ORAL
  Filled 2019-05-08 (×2): qty 1

## 2019-05-08 NOTE — MAU Note (Signed)
Started leaking at 11:30 am small amount clear fluid. Then larger gush at 6:30 pm.  Baby moving well.  No bleeding.  No contractions.

## 2019-05-08 NOTE — MAU Provider Note (Signed)
Chief Complaint:  Rupture of Membranes   First Provider Initiated Contact with Patient 05/08/19 2037      HPI: Sierra Wu is a 23 y.o. G1P0 at 77w2dwho presents to maternity admissions reporting leaking fluid.. She reports good fetal movement, denies vaginal bleeding, vaginal itching/burning, urinary symptoms, h/a, dizziness, n/v, diarrhea, constipation or fever/chills.    RN exam was negative so I was asked to do a speculum exam  Past Medical History: Past Medical History:  Diagnosis Date  . Gestational diabetes   . STD (sexually transmitted disease) 08/2012   Dx'd with HSV II    Past obstetric history: OB History  Gravida Para Term Preterm AB Living  1 0       0  SAB TAB Ectopic Multiple Live Births               # Outcome Date GA Lbr Len/2nd Weight Sex Delivery Anes PTL Lv  1 Current             Past Surgical History: Past Surgical History:  Procedure Laterality Date  . NO PAST SURGERIES      Family History: Family History  Problem Relation Age of Onset  . Healthy Other     Social History: Social History   Tobacco Use  . Smoking status: Never Smoker  . Smokeless tobacco: Never Used  Substance Use Topics  . Alcohol use: No  . Drug use: No    Allergies: No Known Allergies  Meds:  Medications Prior to Admission  Medication Sig Dispense Refill Last Dose  . Accu-Chek FastClix Lancets MISC 1 Device by Percutaneous route 4 (four) times daily. To check blood sugars 4 times a day. Fasting and 2 hours after breakfast, lunch and dinner 100 each 12 05/08/2019 at Unknown time  . ferrous sulfate 325 (65 FE) MG tablet Take 1 tablet (325 mg total) by mouth daily. 30 tablet 11 05/08/2019 at Unknown time  . glucose blood (ACCU-CHEK GUIDE) test strip Use as instructed 100 each 12 05/08/2019 at Unknown time  . Prenatal Vit-Fe Fumarate-FA (PREPLUS) 27-1 MG TABS Take 1 tablet by mouth daily.   05/07/2019 at Unknown time  . valACYclovir (VALTREX) 500 MG tablet Take 1  tablet (500 mg total) by mouth 2 (two) times daily. 60 tablet 6 05/08/2019 at Unknown time    I have reviewed patient's Past Medical Hx, Surgical Hx, Family Hx, Social Hx, medications and allergies.   ROS:  Review of Systems  Constitutional: Negative for chills and fever.  Respiratory: Negative for shortness of breath.   Genitourinary: Positive for pelvic pain and vaginal discharge. Negative for vaginal bleeding.  Neurological: Negative for dizziness.   Other systems negative  Physical Exam   Patient Vitals for the past 24 hrs:  BP Temp Temp src Pulse Resp SpO2 Height Weight  05/08/19 2018 136/82 98.2 F (36.8 C) Oral (!) 105 16 98 % 5\' 4"  (1.626 m) 86.6 kg   Constitutional: Well-developed, well-nourished female in no acute distress.  Cardiovascular: normal rate and rhythm Respiratory: normal effort, clear to auscultation bilaterally GI: Abd soft, non-tender, gravid appropriate for gestational age.   No rebound or guarding. MS: Extremities nontender, no edema, normal ROM Neurologic: Alert and oriented x 4.  GU: Neg CVAT.  PELVIC EXAM: Cervix pink, visually closed, without lesion, moderate clear fluid in vault, vaginal walls and external genitalia normal    FHT:  Baseline 140 , moderate variability, accelerations present, late decelerations Contractions: q 6 mins Irregular  Cervix closed/40%/-2/soft  Labs: No results found for this or any previous visit (from the past 24 hour(s)). O/Positive/-- (04/21 0034)  Imaging:    MAU Course/MDM: I have ordered labs and reviewed results.  NST reviewed, equivocal   Assessment: 1. Polyhydramnios in third trimester, antepartum complication   2. GBS bacteriuria   3. Supervision of high risk pregnancy, antepartum   4.     Equivocal feta heart rate pattern 5.     Spontaneous rupture of membranes  Plan: Admit to Labor and Delivery Labor team to follow.  Hansel Feinstein CNM, MSN Certified Nurse-Midwife 05/08/2019 8:48  PM

## 2019-05-08 NOTE — Telephone Encounter (Signed)
Preadmission screen  

## 2019-05-08 NOTE — H&P (Signed)
Sierra Wu is a 23 y.o. G1P0 female at [redacted]w[redacted]d by LMP presenting w/ SROM clear fluid started @ 1130, bigger gush @ 1830.   Reports active fetal movement, contractions: none, vaginal bleeding: none, membranes: ruptured, clear fluid.  Initiated prenatal care at [facility] at 12 wks.   Most recent EFW u/s 37% @ 23wks, had f/u u/s scheduled for tomorrow.  BPP 10/18 @ 38wks poly @ 29cm  This pregnancy complicated by: A1DM Polyhydramnios 29cm GBS+ urine HSV2 on valtrex-no recent outbreaks  Prenatal History/Complications:  G1  Past Medical History: Past Medical History:  Diagnosis Date  . Gestational diabetes   . STD (sexually transmitted disease) 08/2012   Dx'd with HSV II    Past Surgical History: Past Surgical History:  Procedure Laterality Date  . NO PAST SURGERIES      Obstetrical History: OB History    Gravida  1   Para  0   Term      Preterm      AB      Living  0     SAB      TAB      Ectopic      Multiple      Live Births              Social History: Social History   Socioeconomic History  . Marital status: Single    Spouse name: Not on file  . Number of children: Not on file  . Years of education: Not on file  . Highest education level: Not on file  Occupational History  . Not on file  Social Needs  . Financial resource strain: Not on file  . Food insecurity    Worry: Never true    Inability: Never true  . Transportation needs    Medical: No    Non-medical: No  Tobacco Use  . Smoking status: Never Smoker  . Smokeless tobacco: Never Used  Substance and Sexual Activity  . Alcohol use: No  . Drug use: No  . Sexual activity: Yes    Birth control/protection: None  Lifestyle  . Physical activity    Days per week: Not on file    Minutes per session: Not on file  . Stress: Not on file  Relationships  . Social Musician on phone: Not on file    Gets together: Not on file    Attends religious service: Not on file     Active member of club or organization: Not on file    Attends meetings of clubs or organizations: Not on file    Relationship status: Not on file  Other Topics Concern  . Not on file  Social History Narrative  . Not on file    Family History: Family History  Problem Relation Age of Onset  . Healthy Other     Allergies: No Known Allergies  Medications Prior to Admission  Medication Sig Dispense Refill Last Dose  . Accu-Chek FastClix Lancets MISC 1 Device by Percutaneous route 4 (four) times daily. To check blood sugars 4 times a day. Fasting and 2 hours after breakfast, lunch and dinner 100 each 12 05/08/2019 at Unknown time  . ferrous sulfate 325 (65 FE) MG tablet Take 1 tablet (325 mg total) by mouth daily. 30 tablet 11 05/08/2019 at Unknown time  . glucose blood (ACCU-CHEK GUIDE) test strip Use as instructed 100 each 12 05/08/2019 at Unknown time  . Prenatal Vit-Fe Fumarate-FA (PREPLUS) 27-1 MG TABS Take  1 tablet by mouth daily.   05/07/2019 at Unknown time  . valACYclovir (VALTREX) 500 MG tablet Take 1 tablet (500 mg total) by mouth 2 (two) times daily. 60 tablet 6 05/08/2019 at Unknown time    Review of Systems  Pertinent pos/neg as indicated in HPI  Blood pressure 136/82, pulse (!) 105, temperature 98.2 F (36.8 C), temperature source Oral, resp. rate 16, height 5\' 4"  (1.626 m), weight 86.6 kg, last menstrual period 08/13/2018, SpO2 98 %. General appearance: alert, cooperative and no distress Lungs: clear to auscultation bilaterally Heart: regular rate and rhythm Abdomen: gravid, soft, non-tender Extremities: tr edema DTR's 2+  Fetal monitoring: FHR: 120 bpm, variability: moderate,  Accelerations: Present,  decelerations:  Present lates initially, improved w/ interventions Uterine activity: irregular Dilation: Closed Effacement (%): 40 Station: -2, Ballotable Exam by:: [place] CNM Presentation: cephalic   Prenatal labs: ABO, Rh: O/Positive/-- (04/21  0946) Antibody: Negative (04/21 0946) Rubella: 2.68 (04/21 0946) RPR: Non Reactive (08/12 0848)  HBsAg: Negative (04/21 0946)  HIV: Non Reactive (08/12 0848)  GBS:   Pos urine 2hr GTT: 92/170/182  No results found for this or any previous visit (from the past 24 hour(s)).   Assessment:  [redacted]w[redacted]d SIUP  G1P0  PROM  Cat 2 FHR initially, Cat1 now  GBS  Pos urine  A1DM  Polyhydramnios  HSV2 on valtrex-no recent outbreaks  Plan:  Admit to L&D  IV pain meds/epidural prn active labor  Expectant management for now, if remains Cat 1 will start cytotec  Anticipate NSVB   Plans to breastfeed  Contraception: none  Circumcision: outpatient  CBGs q2hr  Roma Schanz CNM, WHNP-BC 05/08/2019, 9:12 PM

## 2019-05-09 ENCOUNTER — Inpatient Hospital Stay (HOSPITAL_COMMUNITY): Payer: 59 | Admitting: Anesthesiology

## 2019-05-09 ENCOUNTER — Ambulatory Visit (HOSPITAL_COMMUNITY): Payer: 59 | Attending: Obstetrics and Gynecology

## 2019-05-09 ENCOUNTER — Encounter (HOSPITAL_COMMUNITY): Payer: Self-pay

## 2019-05-09 DIAGNOSIS — O99824 Streptococcus B carrier state complicating childbirth: Secondary | ICD-10-CM

## 2019-05-09 DIAGNOSIS — Z3A38 38 weeks gestation of pregnancy: Secondary | ICD-10-CM

## 2019-05-09 DIAGNOSIS — O4202 Full-term premature rupture of membranes, onset of labor within 24 hours of rupture: Secondary | ICD-10-CM

## 2019-05-09 DIAGNOSIS — O24410 Gestational diabetes mellitus in pregnancy, diet controlled: Secondary | ICD-10-CM

## 2019-05-09 LAB — GLUCOSE, CAPILLARY
Glucose-Capillary: 88 mg/dL (ref 70–99)
Glucose-Capillary: 83 mg/dL (ref 70–99)
Glucose-Capillary: 83 mg/dL (ref 70–99)
Glucose-Capillary: 95 mg/dL (ref 70–99)
Glucose-Capillary: 79 mg/dL (ref 70–99)
Glucose-Capillary: 92 mg/dL (ref 70–99)

## 2019-05-09 LAB — RPR: RPR Ser Ql: NONREACTIVE

## 2019-05-09 MED ORDER — SIMETHICONE 80 MG PO CHEW: 80.0000 mg | CHEWABLE_TABLET | ORAL | Status: DC | PRN

## 2019-05-09 MED ORDER — EPHEDRINE 5 MG/ML INJ: 10.0000 mg | INTRAVENOUS | Status: DC | PRN

## 2019-05-09 MED ORDER — DIBUCAINE (PERIANAL) 1 % EX OINT: 1.0000 "application " | TOPICAL_OINTMENT | CUTANEOUS | Status: DC | PRN

## 2019-05-09 MED ORDER — COCONUT OIL OIL: 1.0000 "application " | TOPICAL_OIL | Status: DC | PRN

## 2019-05-09 MED ORDER — LACTATED RINGERS IV SOLN: 500.0000 mL | Freq: Once | INTRAVENOUS | Status: DC

## 2019-05-09 MED ORDER — OXYTOCIN 40 UNITS IN NORMAL SALINE INFUSION - SIMPLE MED
1.0000 m[IU]/min | INTRAVENOUS | Status: DC
  Administered 2019-05-09 (×2): 2 m[IU]/min via INTRAVENOUS

## 2019-05-09 MED ORDER — FENTANYL-BUPIVACAINE-NACL 0.5-0.125-0.9 MG/250ML-% EP SOLN
12.0000 mL/h | EPIDURAL | Status: DC | PRN
  Filled 2019-05-09: qty 250

## 2019-05-09 MED ORDER — LACTATED RINGERS AMNIOINFUSION
INTRAVENOUS | Status: DC
  Administered 2019-05-09 (×2): via INTRAUTERINE

## 2019-05-09 MED ORDER — ACETAMINOPHEN 325 MG PO TABS
650.0000 mg | ORAL_TABLET | ORAL | Status: DC | PRN
  Administered 2019-05-10 (×3): 650 mg via ORAL
  Filled 2019-05-09 (×3): qty 2

## 2019-05-09 MED ORDER — ONDANSETRON HCL 4 MG PO TABS: 4.0000 mg | ORAL_TABLET | ORAL | Status: DC | PRN

## 2019-05-09 MED ORDER — DIPHENHYDRAMINE HCL 25 MG PO CAPS: 25.0000 mg | ORAL_CAPSULE | Freq: Four times a day (QID) | ORAL | Status: DC | PRN

## 2019-05-09 MED ORDER — SENNOSIDES-DOCUSATE SODIUM 8.6-50 MG PO TABS
2.0000 | ORAL_TABLET | ORAL | Status: DC
  Administered 2019-05-10: 2 via ORAL
  Filled 2019-05-09: qty 2

## 2019-05-09 MED ORDER — ONDANSETRON HCL 4 MG/2ML IJ SOLN: 4.0000 mg | INTRAMUSCULAR | Status: DC | PRN

## 2019-05-09 MED ORDER — PHENYLEPHRINE 40 MCG/ML (10ML) SYRINGE FOR IV PUSH (FOR BLOOD PRESSURE SUPPORT): 80.0000 ug | PREFILLED_SYRINGE | INTRAVENOUS | Status: DC | PRN

## 2019-05-09 MED ORDER — PRENATAL MULTIVITAMIN CH
1.0000 | ORAL_TABLET | Freq: Every day | ORAL | Status: DC
  Administered 2019-05-10: 1 via ORAL
  Filled 2019-05-09: qty 1

## 2019-05-09 MED ORDER — BENZOCAINE-MENTHOL 20-0.5 % EX AERO
1.0000 "application " | INHALATION_SPRAY | CUTANEOUS | Status: DC | PRN
  Filled 2019-05-09 (×2): qty 56

## 2019-05-09 MED ORDER — LIDOCAINE-EPINEPHRINE (PF) 2 %-1:200000 IJ SOLN
INTRAMUSCULAR | Status: DC | PRN
  Administered 2019-05-09 (×2): 2 mL via EPIDURAL

## 2019-05-09 MED ORDER — WITCH HAZEL-GLYCERIN EX PADS: 1.0000 "application " | MEDICATED_PAD | CUTANEOUS | Status: DC | PRN

## 2019-05-09 MED ORDER — SODIUM CHLORIDE (PF) 0.9 % IJ SOLN
INTRAMUSCULAR | Status: DC | PRN
  Administered 2019-05-09: 12 mL/h via EPIDURAL

## 2019-05-09 MED ORDER — TERBUTALINE SULFATE 1 MG/ML IJ SOLN
0.2500 mg | Freq: Once | INTRAMUSCULAR | Status: AC | PRN
  Administered 2019-05-09: 0.25 mg via SUBCUTANEOUS

## 2019-05-09 MED ORDER — INFLUENZA VAC SPLIT QUAD 0.5 ML IM SUSY: 0.5000 mL | PREFILLED_SYRINGE | INTRAMUSCULAR | Status: DC

## 2019-05-09 MED ORDER — IBUPROFEN 600 MG PO TABS
600.0000 mg | ORAL_TABLET | Freq: Four times a day (QID) | ORAL | Status: DC
  Administered 2019-05-10 (×3): 600 mg via ORAL
  Filled 2019-05-09 (×3): qty 1

## 2019-05-09 MED ORDER — DIPHENHYDRAMINE HCL 50 MG/ML IJ SOLN: 12.5000 mg | INTRAMUSCULAR | Status: DC | PRN

## 2019-05-09 NOTE — Anesthesia Preprocedure Evaluation (Signed)
Anesthesia Evaluation  Patient identified by MRN, date of birth, ID band Patient awake    Reviewed: Allergy & Precautions, NPO status , Patient's Chart, lab work & pertinent test results  Airway Mallampati: II  TM Distance: >3 FB Neck ROM: Full    Dental no notable dental hx.    Pulmonary neg pulmonary ROS,    Pulmonary exam normal breath sounds clear to auscultation       Cardiovascular negative cardio ROS Normal cardiovascular exam Rhythm:Regular Rate:Normal     Neuro/Psych negative neurological ROS  negative psych ROS   GI/Hepatic negative GI ROS, Neg liver ROS,   Endo/Other  negative endocrine ROSdiabetes, Gestational  Renal/GU negative Renal ROS  negative genitourinary   Musculoskeletal negative musculoskeletal ROS (+)   Abdominal   Peds  Hematology negative hematology ROS (+)   Anesthesia Other Findings   Reproductive/Obstetrics (+) Pregnancy                             Anesthesia Physical Anesthesia Plan  ASA: III  Anesthesia Plan: Epidural   Post-op Pain Management:    Induction:   PONV Risk Score and Plan: Treatment may vary due to age or medical condition  Airway Management Planned: Natural Airway  Additional Equipment:   Intra-op Plan:   Post-operative Plan:   Informed Consent: I have reviewed the patients History and Physical, chart, labs and discussed the procedure including the risks, benefits and alternatives for the proposed anesthesia with the patient or authorized representative who has indicated his/her understanding and acceptance.       Plan Discussed with: Anesthesiologist  Anesthesia Plan Comments: (Patient identified. Risks, benefits, options discussed with patient including but not limited to bleeding, infection, nerve damage, paralysis, failed block, incomplete pain control, headache, blood pressure changes, nausea, vomiting, reactions to  medication, itching, and post partum back pain. Confirmed with bedside nurse the patient's most recent platelet count. Confirmed with the patient that they are not taking any anticoagulation, have any bleeding history or any family history of bleeding disorders. Patient expressed understanding and wishes to proceed. All questions were answered. )        Anesthesia Quick Evaluation  

## 2019-05-09 NOTE — Progress Notes (Signed)
LABOR PROGRESS NOTE  Sierra Wu is a 23 y.o. G1P0 at [redacted]w[redacted]d  admitted for IOL for PROM  Subjective: Patient comfortable with epidural   Objective: BP 103/65   Pulse 90   Temp 98 F (36.7 C) (Oral)   Resp 16   Ht 5\' 4"  (1.626 m)   Wt 86.6 kg   LMP 08/13/2018 (Exact Date)   SpO2 99%   BMI 32.79 kg/m  or  Vitals:   05/09/19 1416 05/09/19 1421 05/09/19 1432 05/09/19 1501  BP: 123/75 128/77 111/67 103/65  Pulse: 85 91 83 90  Resp:    16  Temp:   98 F (36.7 C)   TempSrc:   Oral   SpO2:      Weight:      Height:        Pitocin discontinued at 1450 Dilation: 5.5 Effacement (%): 90 Cervical Position: Middle Station: -1 Presentation: Vertex Exam by:: E. Rothermel RN FHT: baseline rate 130, moderate varibility, no accel, late decel Toco: 5-6  Labs: Lab Results  Component Value Date   WBC 7.1 05/08/2019   HGB 11.0 (L) 05/08/2019   HCT 34.0 (L) 05/08/2019   MCV 86.5 05/08/2019   PLT 183 05/08/2019    Patient Active Problem List   Diagnosis Date Noted  . Leakage of amniotic fluid 05/08/2019  . Polyhydramnios in third trimester, antepartum complication 16/04/9603  . Gestational diabetes 04/20/2019  . 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 GBS bacteriuria 11/13/2018  . Supervision of high risk pregnancy, antepartum 10/24/2018  . Irregular menses 04/25/2017    Assessment / Plan: 23 y.o. G1P0 at [redacted]w[redacted]d here for IOL for PROM   Labor: Slow progress, pitocin discontinued due to late decelerations at 1450. FHR resolved to baseline and late decelerations from 1500-1515. Then late decelerations resumed- consult with Dr Si Raider @ 1535 who recommends terb- okay with restarting pitocin after break as long as FHR tracing resolves. Fetal Wellbeing:  Cat II  Pain Control:  Epidural  Anticipated MOD:  Hopeful SVD  Lajean Manes, CNM 05/09/2019, 3:41 PM

## 2019-05-09 NOTE — Progress Notes (Signed)
Patient ID: Sierra Wu, female   DOB: February 28, 1996, 23 y.o.   MRN: [id number] Sierra Wu is a 23 y.o. G1P0 at [redacted]w[redacted]d admitted for induction of labor due to PROM.  Subjective: Not feeling any uc's, comfortable  Objective: BP 123/77 (BP Location: Right Arm)   Pulse 94   Temp 97.8 F (36.6 C) (Oral)   Resp 17   Ht 5\' 4"  (1.626 m)   Wt 86.6 kg   LMP 08/13/2018 (Exact Date)   SpO2 98%   BMI 32.79 kg/m  No intake/output data recorded.  FHT:  FHR: 135 bpm, variability: moderate,  accelerations:  Present,  decelerations:  Absent UC:   irregular  SVE:   Dilation: Fingertip Effacement (%): Thick Station: -3 Exam by:: CNM K Marianita Botkin  Labs: Lab Results  Component Value Date   WBC 7.1 05/08/2019   HGB 11.0 (L) 05/08/2019   HCT 34.0 (L) 05/08/2019   MCV 86.5 05/08/2019   PLT 183 05/08/2019   CBG (last 3)  Recent Labs    05/08/19 2231 05/08/19 2254  GLUCAP 66* 94     Assessment / Plan: IOL d/t PROM, no cervical change since admit, lates present on admit have since resolved, currently Cat1, will start cytotec per protocol, plan foley bulb when able  Labor: cervical ripening Fetal Wellbeing:  Category I Pain Control:  n/a Pre-eclampsia: n/a I/D:  PCN for GBS+ Anticipated MOD:  NSVD  Roma Schanz CNM, WHNP-BC 05/09/2019, 1:23 AM

## 2019-05-09 NOTE — Progress Notes (Signed)
LABOR PROGRESS NOTE  Sierra Wu is a 23 y.o. G1P0 at [redacted]w[redacted]d  admitted for IOL for PROM on 10/20- light mec, pregnancy complicated by W7PXT  Subjective: Patient breathing through contractions, requesting additional medication but does not want epidural   Objective: BP 125/76   Pulse 74   Temp 97.8 F (36.6 C) (Oral)   Resp 18   Ht 5\' 4"  (1.626 m)   Wt 86.6 kg   LMP 08/13/2018 (Exact Date)   SpO2 98%   BMI 32.79 kg/m  or  Vitals:   05/09/19 0808 05/09/19 0937 05/09/19 1105 05/09/19 1120  BP: 124/81 116/75 125/76   Pulse: 77 71 74   Resp: 18 18 18    Temp: 98 F (36.7 C)   97.8 F (36.6 C)  TempSrc: Oral   Oral  SpO2:      Weight:      Height:        IUPC placed and Amnioinfusion initiated  Dilation: 5.5 Effacement (%): 90 Cervical Position: Posterior Station: -1 Presentation: Vertex Exam by:: Wende Bushy CNM FHT: baseline rate 135, moderate varibility, no accel, variable and occasional late decel Toco: 3-6, moderate by palpation   Labs: Lab Results  Component Value Date   WBC 7.1 05/08/2019   HGB 11.0 (L) 05/08/2019   HCT 34.0 (L) 05/08/2019   MCV 86.5 05/08/2019   PLT 183 05/08/2019    Patient Active Problem List   Diagnosis Date Noted  . Leakage of amniotic fluid 05/08/2019  . Polyhydramnios in third trimester, antepartum complication 01/12/9484  . Gestational diabetes 04/20/2019  . 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 GBS bacteriuria 11/13/2018  . Supervision of high risk pregnancy, antepartum 10/24/2018  . Irregular menses 04/25/2017    Assessment / Plan: 23 y.o. G1P0 at [redacted]w[redacted]d here for PROM   Labor: Progressing well on pitocin, IUPC placed with amnio  Fetal Wellbeing:  Cat II  Pain Control:  IV Fentanyl  Anticipated MOD:  SVD  Lajean Manes, CNM, 05/09/2019, 12:27 PM

## 2019-05-09 NOTE — Progress Notes (Signed)
Patient ID: Sierra Wu, female   DOB: 09-Jan-1996, 23 y.o.   MRN: [id number] Sierra Wu is a 23 y.o. G1P0 at [redacted]w[redacted]d admitted for induction of labor due to PROM.  Subjective: Doing well, not really feeling contractions  Objective: BP 125/68   Pulse 66   Temp 98.2 F (36.8 C) (Oral)   Resp 18   Ht 5\' 4"  (1.626 m)   Wt 86.6 kg   LMP 08/13/2018 (Exact Date)   SpO2 98%   BMI 32.79 kg/m  No intake/output data recorded.  FHT:  FHR: 135 bpm, variability: moderate,  accelerations:  Present,  decelerations:  Present random lates UC:   q 2-15mins  SVE:   Dilation: 1 Effacement (%): Thick Station: -3 Exam by:: CNM Sierra Wu   Cervical foley bulb inserted and inflated w/ 66ml LR w/o difficulty   Labs: Lab Results  Component Value Date   WBC 7.1 05/08/2019   HGB 11.0 (L) 05/08/2019   HCT 34.0 (L) 05/08/2019   MCV 86.5 05/08/2019   PLT 183 05/08/2019    Assessment / Plan: IOL d/t PROM @ 33 yesterday, also has A1DM, polyhydramnios. S/P cytotec x 1, had some random occasional lates. Foley bulb now placed, will try low dose pitocin to max of 36mu/min while foley bulb in  Labor: ripening Fetal Wellbeing:  Category II Pain Control:  n/a Pre-eclampsia: n/a I/D:  GBS+ PCN Anticipated MOD:  NSVD  Sierra Wu CNM, WHNP-BC 05/09/2019, 6:26 AM

## 2019-05-09 NOTE — Anesthesia Procedure Notes (Signed)
Epidural Patient location during procedure: OB Start time: 05/09/2019 1:45 PM End time: 05/09/2019 2:00 PM  Staffing Anesthesiologist: Freddrick March, MD Performed: anesthesiologist   Preanesthetic Checklist Completed: patient identified, pre-op evaluation, timeout performed, IV checked, risks and benefits discussed and monitors and equipment checked  Epidural Patient position: sitting Prep: site prepped and draped and DuraPrep Patient monitoring: continuous pulse ox, blood pressure, heart rate and cardiac monitor Approach: midline Location: L3-L4 Injection technique: LOR air  Needle:  Needle type: Tuohy  Needle gauge: 17 G Needle length: 9 cm Needle insertion depth: 7 cm Catheter type: closed end flexible Catheter size: 19 Gauge Catheter at skin depth: 13 cm Test dose: negative  Assessment Sensory level: T8 Events: blood not aspirated, injection not painful, no injection resistance, negative IV test and no paresthesia  Additional Notes Patient identified. Risks/Benefits/Options discussed with patient including but not limited to bleeding, infection, nerve damage, paralysis, failed block, incomplete pain control, headache, blood pressure changes, nausea, vomiting, reactions to medication both or allergic, itching and postpartum back pain. Confirmed with bedside nurse the patient's most recent platelet count. Confirmed with patient that they are not currently taking any anticoagulation, have any bleeding history or any family history of bleeding disorders. Patient expressed understanding and wished to proceed. All questions were answered. Sterile technique was used throughout the entire procedure. Please see nursing notes for vital signs. Test dose was given through epidural catheter and negative prior to continuing to dose epidural or start infusion. Warning signs of high block given to the patient including shortness of breath, tingling/numbness in hands, complete motor block,  or any concerning symptoms with instructions to call for help. Patient was given instructions on fall risk and not to get out of bed. All questions and concerns addressed with instructions to call with any issues or inadequate analgesia.  Reason for block:procedure for pain

## 2019-05-09 NOTE — Discharge Summary (Signed)
  Postpartum Discharge Summary   Patient Name: Sierra Renderos DOB: 10/17/1995 MRN: [id number]  Date of admission: 05/08/2019 Delivering Provider: SHAW, KIMBERLY D   Date of discharge: 05/10/2019  Admitting diagnosis: [place]   Intrauterine pregnancy: [redacted]w[redacted]d     Secondary diagnosis:  Active Problems:   GBS bacteriuria   HSV-2 infection   Gestational diabetes   Polyhydramnios in third trimester, antepartum complication   Leakage of amniotic fluid  Additional problems: none     Discharge diagnosis: Term Pregnancy Delivered and GDM A1                                                                                                Post partum procedures:None  Augmentation: Pitocin, Cytotec and Foley Balloon  Complications: ROM>24 hours  Hospital course:  Induction of Labor With Vaginal Delivery   23 y.o. yo G1P0 at [redacted]w[redacted]d was admitted to the hospital 05/08/2019 for induction of labor.  Indication for induction: PROM. She had cytotec x 1 followed by cervical foley early in the morning of 10/21. There were some FHR concerns that resolved requiring the Pitocin to be stopped, and when the time came to restart it her cx was complete. She rec'd adequate GBS ppx with PCN intrapartum.   Membrane Rupture Time/Date: 11:30 AM ,05/08/2019   Intrapartum Procedures: Episiotomy: None [1]                                         Lacerations:  2nd degree [3]  Patient had delivery of a Viable infant.  Information for the patient's newborn:  Palencia, Boy Shaniece [[id number]]      05/09/2019  Details of delivery can be found in separate delivery note.  Patient had a routine postpartum course. Patient is discharged home 05/09/19. Delivery time: 8:55 PM    Magnesium Sulfate received: No BMZ received: No Rhophylac:N/A MMR:N/A Transfusion:No  Physical exam  Vitals:   05/09/19 2001 05/09/19 2100 05/09/19 2102 05/09/19 2118  BP: 130/70 132/70 131/68 134/67  Pulse: 80 85 84 87  Resp: 16  16 17   Temp:   99.7 F (37.6 C)   TempSrc:   Axillary   SpO2:      Weight:      Height:       General: alert, cooperative and no distress Lochia: appropriate Uterine Fundus: firm Incision: N/A DVT Evaluation: No evidence of DVT seen on physical exam. Labs: Lab Results  Component Value Date   WBC 7.1 05/08/2019   HGB 11.0 (L) 05/08/2019   HCT 34.0 (L) 05/08/2019   MCV 86.5 05/08/2019   PLT 183 05/08/2019   CMP Latest Ref Rng & Units 08/03/2017  Glucose 65 - 99 mg/dL 92  BUN 6 - 20 mg/dL 7  Creatinine 0.44 - 1.00 mg/dL 0.70  Sodium 135 - 145 mmol/L 142  Potassium 3.5 - 5.1 mmol/L 3.8  Chloride 101 - 111 mmol/L 106    Discharge instruction: per After Visit Summary and "Baby and Me Booklet".  After   visit meds:  Allergies as of 05/10/2019   No Known Allergies     Medication List    STOP taking these medications   Accu-Chek FastClix Lancets Misc   Accu-Chek Guide test strip Generic drug: glucose blood   ferrous sulfate 325 (65 FE) MG tablet   valACYclovir 500 MG tablet Commonly known as: VALTREX     TAKE these medications   acetaminophen 325 MG tablet Commonly known as: Tylenol Take 2 tablets (650 mg total) by mouth every 4 (four) hours as needed (for pain scale < 4).   ibuprofen 600 MG tablet Commonly known as: ADVIL Take 1 tablet (600 mg total) by mouth every 6 (six) hours.   PrePLUS 27-1 MG Tabs Take 1 tablet by mouth daily.       Diet: routine diet  Activity: Advance as tolerated. Pelvic rest for 6 weeks.   Outpatient follow up:4 weeks with GTT Follow up Appt: Future Appointments  Date Time Provider Department Center  06/11/2019  3:55 PM Eckstat, Matthew M, MD [facility]  06/21/2019  8:20 AM [facility]   Follow up Visit: Please schedule this patient for Postpartum visit in: 4 weeks with the following provider: Any provider For C/S patients schedule nurse incision check in weeks 2 weeks: no High risk pregnancy complicated by:  GDM Delivery mode:  SVD Anticipated Birth Control:  declines PP Procedures needed: 2 hour GTT  Schedule Integrated BH visit: no   Newborn Data: Live born female  Birth Weight: 3031gm (6lb 10.9oz) APGAR: 6, 9  Newborn Delivery   Birth date/time: 05/09/2019 20:55:00 Delivery type: Vaginal, Spontaneous      Baby Feeding: Expressed breast milk and formula Disposition: Newborn is being transferred to [facility]   Patient requesting discharge at 19 hours postpartum due to imminent transfer or newborn to [facility]. Discharge approved by Dr. Constant. Discharge teaching, signs of worsening acuity, plan for follow-up reviewed with patient and partner at 3:45pm by CNM.   Samantha Weinhold, MSN, CNM Certified Nurse Midwife, [facility], [facility] 05/10/19 3:50 PM      

## 2019-05-09 NOTE — Progress Notes (Addendum)
Postpartum Day # 1   Delivering provider: NORTON, BRANDY N  Pain control at delivery: Epidural  Episiotomy:None  Lacerations:2nd degree   Live born female  Birth Weight: 6 lb 10.9 oz (3031 g) APGAR: 6, 9  Newborn Delivery   Birth date/time: 05/09/2019 20:55:00 Delivery type: Vaginal, Spontaneous      Baby name: Sierra Wu Feeding: breast circumcision planned  S:  Reports feeling well.             Tolerating po/ No nausea or vomiting             Bleeding is light             Pain controlled with acetaminophen and prescription NSAID's including ibuprofen             Up ad lib / ambulatory / voiding without difficulties   O:  A & O x 3, in no apparent distress              VS:  Vitals:   05/09/19 2118 05/09/19 2132 05/09/19 2147 05/09/19 2232  BP: 134/67 118/67 110/70 (!) 141/83  Pulse: 87 72 85 74  Resp: 17 17 17    Temp:      TempSrc:      SpO2:      Weight:      Height:        LABS:  Recent Labs    05/08/19 2055  WBC 7.1  HGB 11.0*  HCT 34.0*  PLT 183    Blood type: --/--/O POS, O POS Performed at [facility], [address] [place], [place]  [phone] 2055)  Rubella: 2.68 (04/21 0946)   I&O: I/O last 3 completed shifts: In: -  Out: 600 [Urine:600]          Total I/O In: -  Out: 550 [Urine:350; Blood:200]  Vaccines: TDaP UTD         Flu    Ordered   Lungs: Clear and unlabored  Heart: regular rate and rhythm / no murmurs  Abdomen: soft, non-tender, non-distended             Fundus: firm, non-tender, U-1  Perineum: Well approximated, trace edema  Lochia: Light  Extremities: No edema, no calf pain or tenderness    A/P: PPD # 1 23 y.o., G1P0   Principal Problem:   Postpartum care following vaginal delivery Active Problems:   GBS bacteriuria   HSV-2 infection   Gestational diabetes   Second degree perineal laceration during delivery   Doing well - stable status  Routine post partum orders  Anticipate discharge tomorrow   04-27-1996, SNM, BSN 05/09/2019, 10:45 PM   CNM attestation Post Partum Day #1 I have seen and examined this patient and agree with above documentation in the nurse midwife student's note.   Dwight Burdo is a 23 y.o. G1P1001 s/p vag del.  Pt denies problems with ambulating, voiding or po intake. Pain is well controlled.  Plan for birth control is declines currently.  Method of Feeding: breast  Infant moved to NICU due to suspected sepsis.  PE:  BP 119/69 (BP Location: Left Arm)   Pulse 72   Temp (!) 97.5 F (36.4 C) (Oral)   Resp 18   Ht 5\' 4"  (1.626 m)   Wt 86.6 kg   LMP 08/13/2018 (Exact Date)   SpO2 99%   Breastfeeding Unknown   BMI 32.79 kg/m  Fundus firm  Plan for discharge: 05/11/19  Myrtis Ser, CNM 8:30 AM  05/10/2019

## 2019-05-10 MED ORDER — IBUPROFEN 600 MG PO TABS: 600.0000 mg | ORAL_TABLET | Freq: Four times a day (QID) | ORAL | 0 refills | Status: DC

## 2019-05-10 MED ORDER — ACETAMINOPHEN 325 MG PO TABS: 650.0000 mg | ORAL_TABLET | ORAL | Status: DC | PRN

## 2019-05-10 NOTE — Progress Notes (Signed)
Set up and reviewed DEBP with MOB including: pumping frequency, cleaning pump parts, and machine settings and operation. Also reviewed hand pump with MOB. MOB understands she can pump and provide colostrum/breast milk to NICU. Patient verbalized understanding of teaching listed above and was able to demonstrate understanding with teach back.

## 2019-05-10 NOTE — Anesthesia Postprocedure Evaluation (Signed)
Anesthesia Post Note  Patient: Sierra Wu  Procedure(s) Performed: AN AD HOC LABOR EPIDURAL     Patient location during evaluation: Mother Baby Anesthesia Type: Epidural Level of consciousness: awake and alert and oriented Pain management: satisfactory to patient Vital Signs Assessment: post-procedure vital signs reviewed and stable Respiratory status: respiratory function stable Cardiovascular status: stable Postop Assessment: no headache, no backache, epidural receding, patient able to bend at knees, no signs of nausea or vomiting and adequate PO intake Anesthetic complications: no    Last Vitals:  Vitals:   05/10/19 0418 05/10/19 0628  BP: 121/82 115/75  Pulse: 71 69  Resp: 18 18  Temp: 36.6 C   SpO2: 99% 99%    Last Pain:  Vitals:   05/10/19 0628  TempSrc:   PainSc: 5    Pain Goal:                   Sierra Wu

## 2019-05-10 NOTE — Discharge Instructions (Signed)

## 2019-05-10 NOTE — Progress Notes (Signed)
Patient screened out for psychosocial assessment since none of the following apply:  Psychosocial stressors documented in mother or baby's chart  Gestation less than 32 weeks  Code at delivery   Infant with anomalies Please contact the Clinical Social Worker if specific needs arise, by MOB's request, or if MOB scores greater than 9/yes to question 10 on Edinburgh Postpartum Depression Screen.  Raeley Gilmore, LCSW Clinical Social Worker [facility] Cell#: [phone]     

## 2019-05-11 ENCOUNTER — Other Ambulatory Visit (HOSPITAL_COMMUNITY): Payer: 59

## 2019-05-13 ENCOUNTER — Inpatient Hospital Stay (HOSPITAL_COMMUNITY): Payer: 59

## 2019-05-13 ENCOUNTER — Inpatient Hospital Stay (HOSPITAL_COMMUNITY): Admission: AD | Admit: 2019-05-13 | Payer: 59 | Source: Home / Self Care | Admitting: Family Medicine

## 2019-05-14 ENCOUNTER — Encounter: Payer: 59 | Admitting: Family Medicine

## 2019-05-21 ENCOUNTER — Telehealth: Payer: 59 | Admitting: Obstetrics and Gynecology

## 2019-05-25 ENCOUNTER — Other Ambulatory Visit: Payer: Self-pay

## 2019-05-25 ENCOUNTER — Ambulatory Visit (INDEPENDENT_AMBULATORY_CARE_PROVIDER_SITE_OTHER): Payer: 59 | Admitting: Obstetrics & Gynecology

## 2019-05-25 ENCOUNTER — Other Ambulatory Visit (HOSPITAL_COMMUNITY)
Admission: RE | Admit: 2019-05-25 | Discharge: 2019-05-25 | Disposition: A | Discharge status: Home / Self Care | Payer: 59 | Source: Ambulatory Visit | Attending: Obstetrics & Gynecology | Admitting: Obstetrics & Gynecology

## 2019-05-25 VITALS — BP 121/82 | HR 96 | Temp 98.2°F | Wt 166.5 lb

## 2019-05-25 DIAGNOSIS — N898 Other specified noninflammatory disorders of vagina: Secondary | ICD-10-CM

## 2019-05-25 DIAGNOSIS — Z30430 Encounter for insertion of intrauterine contraceptive device: Secondary | ICD-10-CM

## 2019-05-25 MED ORDER — LEVONORGESTREL 19.5 MCG/DAY IU IUD
INTRAUTERINE_SYSTEM | Freq: Once | INTRAUTERINE | Status: AC
  Administered 2019-05-25: 12:00:00 via INTRAUTERINE

## 2019-05-25 MED ORDER — IBUPROFEN 800 MG PO TABS: 800.0000 mg | ORAL_TABLET | Freq: Three times a day (TID) | ORAL | 1 refills | Status: DC | PRN

## 2019-05-25 NOTE — Addendum Note (Signed)
Addended by: Fidela Juneau A on: 05/25/2019 11:51 AM   Modules accepted: Orders

## 2019-05-25 NOTE — Progress Notes (Signed)
   Subjective:    Patient ID: Sierra Wu, female    DOB: September 16, 1995, 23 y.o.   MRN: [id number]  HPI  23 yo single P1 here 2 weeks after a vaginal delivery due to a unpleasant vaginal smell and some perineal discomfort. This discomfort is different from that associated with HSV.  She would also like an IUD for contraception.  She says that she has not had sex since delivery.  Review of Systems Her baby just came home from NICU at [facility].    Objective:   Physical Exam Breathing, conversing, and ambulating normally Well nourished, well hydrated Black female, no apparent distress Abd- benign Perineum healed well There were some long stitches with knots at the vaginal introitus and I cut/removed these. I did a wet prep but her bleeding looked entirely normal with some blood noted. I did not notice an odor.  UPT negative, consent signed, Time out procedure done. Cervix prepped with betadine and grasped with a single tooth tenaculum. Liletta was easily placed and the strings were cut to 3-4 cm. Uterus sounded to 9 cm. She tolerated the procedure well.        Assessment & Plan:  Contraception- Liletta String check/pp exam in 4 weeks Subjective odor- wet prep sent, reassurance given

## 2019-05-25 NOTE — Addendum Note (Signed)
Addended by: Emily Filbert on: 05/25/2019 11:38 AM   Modules accepted: Orders

## 2019-05-28 ENCOUNTER — Telehealth: Payer: 59 | Admitting: Family Medicine

## 2019-05-29 LAB — CERVICOVAGINAL ANCILLARY ONLY
Bacterial Vaginitis (gardnerella): POSITIVE — AB
Candida Glabrata: NEGATIVE
Candida Vaginitis: NEGATIVE
Comment: NEGATIVE
Comment: NEGATIVE
Comment: NEGATIVE
Comment: NEGATIVE
Trichomonas: NEGATIVE

## 2019-05-30 ENCOUNTER — Other Ambulatory Visit: Payer: Self-pay | Admitting: Obstetrics & Gynecology

## 2019-05-30 MED ORDER — METRONIDAZOLE 500 MG PO TABS: 500.0000 mg | ORAL_TABLET | Freq: Two times a day (BID) | ORAL | 0 refills | Status: DC

## 2019-05-30 NOTE — Progress Notes (Unsigned)
Flagyl prescribed for bv seen on wet prep.

## 2019-06-04 ENCOUNTER — Telehealth: Payer: 59 | Admitting: Family Medicine

## 2019-06-10 DIAGNOSIS — Z029 Encounter for administrative examinations, unspecified: Secondary | ICD-10-CM

## 2019-06-11 ENCOUNTER — Ambulatory Visit (INDEPENDENT_AMBULATORY_CARE_PROVIDER_SITE_OTHER): Payer: 59 | Admitting: Family Medicine

## 2019-06-11 ENCOUNTER — Telehealth: Payer: 59 | Admitting: Family Medicine

## 2019-06-11 ENCOUNTER — Encounter: Payer: Self-pay | Admitting: Family Medicine

## 2019-06-11 ENCOUNTER — Other Ambulatory Visit: Payer: Self-pay

## 2019-06-11 DIAGNOSIS — Z1389 Encounter for screening for other disorder: Secondary | ICD-10-CM

## 2019-06-11 DIAGNOSIS — Z30431 Encounter for routine checking of intrauterine contraceptive device: Secondary | ICD-10-CM

## 2019-06-11 NOTE — Progress Notes (Signed)
Subjective:     Sierra Wu is a 23 y.o. female who presents for a postpartum visit. She is 4 weeks postpartum following a SVD. I have fully reviewed the prenatal and intrapartum course. The delivery was at [redacted]w[redacted]d gestational weeks. Outcome: spontaneous vaginal delivery. Anesthesia: epidural. Postpartum course has been unremarkable. Baby's course complicated by transfer to [facility] for possible hemolytic anemia, liver problems. Currently home after 2 weeks in [place]. Sees hepatologist, endocrinologist, and hematologist. [name] is feeding by breast. Bleeding staining only. Bowel function is normal. Bladder function is normal. Patient is not sexually active. Contraception method is IUD. Postpartum depression screening: Negative.  The following portions of the patient's history were reviewed and updated as appropriate: allergies, current medications, past family history, past medical history, past social history, past surgical history and problem list.  Review of Systems Pertinent items are noted in HPI.   Objective:    There were no vitals taken for this visit.  General:  alert, cooperative and no distress  Lungs: clear to auscultation bilaterally  Heart:  regular rate and rhythm, S1, S2 normal, no murmur, click, rub or gallop  Abdomen: soft, non-tender; bowel sounds normal; no masses,  no organomegaly   Vulva:  normal  Vagina: normal vagina, no discharge, exudate, lesion, or erythema  Cervix:  multiparous appearance, no cervical motion tenderness and no lesions. Strings present        Assessment:     normal postpartum exam. Pap smear 11/07/2018.  Plan:    1. Contraception: IUD 2. Follow up in: 1 year or as needed.

## 2019-06-18 ENCOUNTER — Encounter: Payer: Self-pay | Admitting: *Deleted

## 2019-06-21 ENCOUNTER — Other Ambulatory Visit: Payer: 59

## 2019-06-27 ENCOUNTER — Other Ambulatory Visit: Payer: 59

## 2019-06-27 ENCOUNTER — Other Ambulatory Visit: Payer: Self-pay

## 2019-06-27 DIAGNOSIS — O24429 Gestational diabetes mellitus in childbirth, unspecified control: Secondary | ICD-10-CM

## 2019-06-28 LAB — GLUCOSE TOLERANCE, 2 HOURS
Glucose, 2 hour: 113 mg/dL (ref 65–139)
Glucose, GTT - Fasting: 80 mg/dL (ref 65–99)

## 2019-08-22 ENCOUNTER — Ambulatory Visit
Admission: EM | Admit: 2019-08-22 | Discharge: 2019-08-22 | Disposition: A | Discharge status: Home / Self Care | Payer: Medicaid Other | Attending: Emergency Medicine | Admitting: Emergency Medicine

## 2019-08-22 DIAGNOSIS — H60313 Diffuse otitis externa, bilateral: Secondary | ICD-10-CM | POA: Diagnosis not present

## 2019-08-22 MED ORDER — NEOMYCIN-POLYMYXIN-HC 3.5-10000-1 OT SOLN: 3.0000 [drp] | Freq: Four times a day (QID) | OTIC | 0 refills | Status: DC

## 2019-08-22 NOTE — Discharge Instructions (Signed)
Use eardrops prescribed for the next week. Return for worsening ear pain, swelling, discharge, bleeding, decreased hearing, development of jaw pain/swelling, fever.  Do NOT use Q-tips as these can cause your ear wax to get stuck, the tips may break off and become a foreign body requiring additional medical care, or puncture your eardrum.  Helpful prevention tip: Use a solution of equal parts isopropyl (rubbing) alcohol and white vinegar (acetic acid) in both ears after swimming.

## 2019-08-22 NOTE — ED Provider Notes (Addendum)
[facility]    CSN: [id number] Arrival date & time: 08/22/19  0944      History   Chief Complaint Chief Complaint  Patient presents with  . Otalgia    HPI Sierra Wu is a 24 y.o. female presenting for left earache since Sunday.  States she has had a little bit of muffled hearing without popping, trauma, discharge, bleeding, fever.  States her right ear is a little bit itchy.  Patient denies prolonged water exposure, travel, foreign body sensation or exposure.  Patient states she did buy new earbuds recently, unsure if this is contributing.  Has not tried any thing for symptoms.   Past Medical History:  Diagnosis Date  . Gestational diabetes   . 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 NO PAST SURGERIES      OB History    Gravida  1   Para  1   Term  1   Preterm      AB      Living  1     SAB      TAB      Ectopic      Multiple      Live Births  1            Home Medications    Prior to Admission medications   Medication Sig Start Date End Date Taking? Authorizing Provider  neomycin-polymyxin-hydrocortisone (CORTISPORIN) OTIC solution Place 3 drops into both ears 4 (four) times daily. 08/22/19   Hall-Potvin, [place], PA-C  Prenatal Vit-Fe Fumarate-FA (PREPLUS) 27-1 MG TABS Take 1 tablet by mouth daily.    [provider]    Family History Family History  Problem Relation Age of Onset  . Healthy Other     Social History Social History   Tobacco Use  . Smoking status: Never Smoker  . Smokeless tobacco: Never Used  Substance Use Topics  . Alcohol use: No  . Drug use: No     Allergies   Patient has no known allergies.   Review of Systems As per HPI   Physical Exam Triage Vital Signs ED Triage Vitals  Enc Vitals Group     BP      Pulse       Resp      Temp      Temp src      SpO2      Weight      Height      Head Circumference      Peak Flow      Pain Score      Pain Loc      Pain Edu?      Excl. in GC?    No data found.  Updated Vital Signs BP (!) 145/79 (BP Location: Left Arm)   Pulse 75   Temp 97.8 F (36.6 C) (Oral)   Resp 16   Breastfeeding Yes   Visual Acuity Right Eye Distance:   Left Eye Distance:   Bilateral Distance:    Right Eye Near:   Left Eye Near:    Bilateral Near:     Physical Exam Constitutional:      General: She is not in acute distress.    Appearance: She is obese. She is not ill-appearing.  HENT:  Head: Normocephalic and atraumatic.     Jaw: There is normal jaw occlusion. No tenderness or pain on movement.     Right Ear: Hearing normal. No tenderness. No mastoid tenderness.     Left Ear: Hearing normal. No tenderness. No mastoid tenderness.     Ears:     Comments: Right ear: Negative tragal tenderness.  EAC nonedematous, though with copious curd-like discharge.  No blood or TM perforation. Left ear: Mild tragal tenderness with moderate EAC swelling and curd-like discharge.  No bleeding, TM perforation visualized. Hearing grossly intact bilaterally    Nose: Nose normal. No nasal deformity, septal deviation or nasal tenderness.     Right Turbinates: Not swollen or pale.     Left Turbinates: Not swollen or pale.     Right Sinus: No maxillary sinus tenderness or frontal sinus tenderness.     Left Sinus: No maxillary sinus tenderness or frontal sinus tenderness.     Mouth/Throat:     Lips: Pink. No lesions.     Mouth: Mucous membranes are moist. No injury.     Pharynx: Oropharynx is clear. Uvula midline. No posterior oropharyngeal erythema or uvula swelling.  Eyes:     Conjunctiva/sclera: Conjunctivae normal.     Pupils: Pupils are equal, round, and reactive to light.  Cardiovascular:     Rate and Rhythm: Normal rate.  Pulmonary:     Effort: Pulmonary effort is normal.  No respiratory distress.     Breath sounds: No wheezing.  Musculoskeletal:     Cervical back: Normal range of motion and neck supple. No tenderness. No muscular tenderness.  Lymphadenopathy:     Cervical: Cervical adenopathy present.  Neurological:     Mental Status: She is alert and oriented to person, place, and time.      UC Treatments / Results  Labs (all labs ordered are listed, but only abnormal results are displayed) Labs Reviewed - No data to display  EKG   Radiology No results found.  Procedures Procedures (including critical care time)  Medications Ordered in UC Medications - No data to display  Initial Impression / Assessment and Plan / UC Course  I have reviewed the triage vital signs and the nursing notes.  Pertinent labs & imaging results that were available during my care of the patient were reviewed by me and considered in my medical decision making (see chart for details).     Patient afebrile, nontoxic, and without history of immunocompromise state.  H&P consistent with bilateral otitis externa: We will start Cortisporin drops as outlined below.  Return precautions discussed, patient verbalized understanding and is agreeable to plan. Final Clinical Impressions(s) / UC Diagnoses   Final diagnoses:  Acute diffuse otitis externa of both ears     Discharge Instructions     Use eardrops prescribed for the next week. Return for worsening ear pain, swelling, discharge, bleeding, decreased hearing, development of jaw pain/swelling, fever.  Do NOT use Q-tips as these can cause your ear wax to get stuck, the tips may break off and become a foreign body requiring additional medical care, or puncture your eardrum.  Helpful prevention tip: Use a solution of equal parts isopropyl (rubbing) alcohol and white vinegar (acetic acid) in both ears after swimming.    ED Prescriptions    Medication Sig Dispense Auth. Provider   neomycin-polymyxin-hydrocortisone  (CORTISPORIN) OTIC solution Place 3 drops into both ears 4 (four) times daily. 10 mL Hall-Potvin, [place], PA-C     PDMP not reviewed this  encounter.   Hall-Potvin, [place], PA-C 08/22/19 1011    Hall-Potvin, [place], [place] 08/22/19 1054

## 2019-08-22 NOTE — ED Triage Notes (Signed)
Pt c/o lt earache since Sunday. States to sensitive to wear ear buds.

## 2020-01-22 ENCOUNTER — Encounter: Payer: Self-pay | Admitting: Nurse Practitioner

## 2020-01-22 ENCOUNTER — Ambulatory Visit (INDEPENDENT_AMBULATORY_CARE_PROVIDER_SITE_OTHER): Payer: Medicaid Other | Admitting: Nurse Practitioner

## 2020-01-22 ENCOUNTER — Other Ambulatory Visit: Payer: Self-pay

## 2020-01-22 VITALS — BP 119/78 | HR 83 | Ht 64.0 in | Wt 188.8 lb

## 2020-01-22 DIAGNOSIS — Z30432 Encounter for removal of intrauterine contraceptive device: Secondary | ICD-10-CM

## 2020-01-22 MED ORDER — IBUPROFEN 600 MG PO TABS: 600.0000 mg | ORAL_TABLET | Freq: Once | ORAL | Status: DC

## 2020-01-22 MED ORDER — IBUPROFEN 800 MG PO TABS
800.0000 mg | ORAL_TABLET | Freq: Once | ORAL | Status: AC
  Administered 2020-01-22: 800 mg via ORAL

## 2020-01-22 NOTE — Progress Notes (Signed)
    GYNECOLOGY OFFICE ENCOUNTER NOTE  History:  24 y.o. G1P1001 here today for today for removal of IUDLiletta  IUD was placed postpartum in November 2020. Is concerned that IUD has caused a 20 pound weight gain and has contributed to not being able to lose weight despite exercise and dietary changes.  Client reports she wants to use condoms and has managed 2 years of preventing pregnancy before becoming pregnant and is quite clear that she wants the IUD removed today.  The following portions of the patient's history were reviewed and updated as appropriate: allergies, current medications, past family history, past medical history, past social history, past surgical history and problem list. Last pap smear on 10-2018 was normal.  Review of Systems:  Pertinent items are noted in HPI.   Objective:  Physical Exam Blood pressure 119/78, pulse 83, height 5\' 4"  (1.626 m), weight 188 lb 12.8 oz (85.6 kg), currently breastfeeding. CONSTITUTIONAL: Well-developed, well-nourished female in no acute distress.  HENT:  Normocephalic, atraumatic. External right and left ear normal. Oropharynx is clear and moist EYES: Conjunctivae and EOM are normal. Pupils are equal, round, and reactive to light. No scleral icterus.  NECK: Normal range of motion, supple, no masses CARDIOVASCULAR: Normal heart rate noted RESPIRATORY: Effort and breath sounds normal, no problems with respiration noted PELVIC: Normal appearing external genitalia; normal appearing vaginal mucosa and cervix.  IUD strings visualized and IUD removed without difficulty.  Client very pleased and requested a dose of ibuprofen in the office.  States she was slightly sore after IUD was removed. Declines additional contraception. Advised no pregnancy until first child is 70 months of age.  Assessment & Plan:  IUD removed.  Plan Will use condoms Pap due 2023 Advised annual exam in November 2021.  December 2021, RN, MSN, NP-BC Nurse  Practitioner, [facility] for [facility] 01/22/2020 9:41 PM

## 2020-04-09 ENCOUNTER — Other Ambulatory Visit: Payer: Self-pay

## 2020-04-09 MED ORDER — FLUCONAZOLE 150 MG PO TABS: 150.0000 mg | ORAL_TABLET | Freq: Once | ORAL | 0 refills | Status: AC

## 2020-12-29 ENCOUNTER — Other Ambulatory Visit: Payer: Self-pay | Admitting: Lactation Services

## 2020-12-29 MED ORDER — VALACYCLOVIR HCL 1 G PO TABS: 1000.0000 mg | ORAL_TABLET | Freq: Two times a day (BID) | ORAL | 1 refills | Status: DC

## 2020-12-29 NOTE — Progress Notes (Signed)
Ordered Valtrex per standing order at patients report or current outbreak, patient advised she needs an annual exam via My Chart.

## 2021-03-19 ENCOUNTER — Other Ambulatory Visit: Payer: Self-pay

## 2021-03-19 ENCOUNTER — Ambulatory Visit (INDEPENDENT_AMBULATORY_CARE_PROVIDER_SITE_OTHER): Payer: 59 | Admitting: Family Medicine

## 2021-03-19 ENCOUNTER — Other Ambulatory Visit (HOSPITAL_COMMUNITY)
Admission: RE | Admit: 2021-03-19 | Discharge: 2021-03-19 | Disposition: A | Discharge status: Home / Self Care | Payer: 59 | Source: Ambulatory Visit | Attending: Family Medicine | Admitting: Family Medicine

## 2021-03-19 ENCOUNTER — Encounter: Payer: Self-pay | Admitting: Family Medicine

## 2021-03-19 VITALS — BP 124/76 | HR 95 | Ht 64.0 in | Wt 162.5 lb

## 2021-03-19 DIAGNOSIS — Z113 Encounter for screening for infections with a predominantly sexual mode of transmission: Secondary | ICD-10-CM | POA: Insufficient documentation

## 2021-03-19 DIAGNOSIS — Z131 Encounter for screening for diabetes mellitus: Secondary | ICD-10-CM | POA: Diagnosis not present

## 2021-03-19 DIAGNOSIS — Z0000 Encounter for general adult medical examination without abnormal findings: Secondary | ICD-10-CM

## 2021-03-19 DIAGNOSIS — Z309 Encounter for contraceptive management, unspecified: Secondary | ICD-10-CM | POA: Diagnosis not present

## 2021-03-19 DIAGNOSIS — B009 Herpesviral infection, unspecified: Secondary | ICD-10-CM | POA: Diagnosis not present

## 2021-03-19 DIAGNOSIS — F419 Anxiety disorder, unspecified: Secondary | ICD-10-CM | POA: Diagnosis not present

## 2021-03-19 MED ORDER — NORGESTIMATE-ETH ESTRADIOL 0.25-35 MG-MCG PO TABS: 1.0000 | ORAL_TABLET | Freq: Every day | ORAL | 11 refills | Status: DC

## 2021-03-19 MED ORDER — VALACYCLOVIR HCL 500 MG PO TABS: 500.0000 mg | ORAL_TABLET | Freq: Two times a day (BID) | ORAL | 1 refills | Status: AC

## 2021-03-19 NOTE — Progress Notes (Signed)
GYNECOLOGY ANNUAL PREVENTATIVE CARE ENCOUNTER NOTE  History:     Sierra Wu is a 25 y.o. G33P1001 female here for a routine annual gynecologic exam.  Current complaints: thickened vaginal discharge without itching or irritation. Sexually active with monogamous partner this weekend and noticed afterwards, used a condom.  Denies abnormal vaginal bleeding, pelvic pain, problems with intercourse or other gynecologic concerns.   Would like refill of valtrex in case of breakouts, no current outbreak.     Gynecologic History Patient's last menstrual period was 02/26/2021 (exact date). Regular menstrual cycles.  Contraception: Condoms only currently. Had IUD taken out last year b/c of weight gain and made her feel "off".  Last Pap: 11/07/2018. Results were: normal (no HPV testing)  Last mammogram: Never, not needed yet. No FH of breast cancer.   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 (Patient not taking: No sig reported) 10 mL 0   Prenatal Vit-Fe Fumarate-FA (PREPLUS) 27-1 MG TABS Take 1 tablet by mouth daily. (Patient not taking: No sig reported)     No current facility-administered medications on file prior to visit.    No Known Allergies  Social History:  reports that she has never smoked. She has never used smokeless tobacco. She reports  that she does not drink alcohol and does not use drugs.  Family History  Problem Relation Age of Onset   Healthy Other     The following portions of the patient's history were reviewed and updated as appropriate: allergies, current medications, past family history, past medical history, past social history, past surgical history and problem list.  Review of Systems Pertinent items noted in HPI and remainder of comprehensive ROS otherwise negative.  Physical Exam:  BP 124/76   Pulse 95   Ht 5\' 4"  (1.626 m)   Wt 162 lb 8 oz (73.7 kg)   LMP 02/26/2021 (Exact Date)   Breastfeeding No   BMI 27.89 kg/m  CONSTITUTIONAL: Well-developed, well-nourished female in no acute distress.  HENT:  Normocephalic, atraumatic, Oropharynx is clear and moist EYES: Conjunctivae and EOM are normal.  NECK: Normal range of motion SKIN: Skin is warm and dry. No rash noted. Not diaphoretic. No erythema. No pallor. MUSCULOSKELETAL: Normal range of motion. No tenderness. 2+ distal pulses. NEUROLOGIC: Alert and oriented to person, place, and time. Normal reflexes. PSYCHIATRIC: Normal mood and affect. Normal behavior. Normal judgment and thought content. CARDIOVASCULAR: Normal heart rate noted, regular rhythm RESPIRATORY: Effort and breath sounds normal, no problems with respiration noted. ABDOMEN: Soft, no distention or tenderness noted.   PELVIC: Normal appearing external genitalia and urethral meatus; normal appearing vaginal mucosa and cervix.  No lesions seen. Small amount of white discharge  noted. Normal uterine size, no other palpable masses, no uterine or adnexal tenderness.  Performed in the presence of a chaperone.   Assessment and Plan:    1. Annual physical exam Doing well. Pelvic unremarkable. Due for pap next year.  Routine preventative health maintenance measures emphasized. - HIV antibody (with reflex) - Hepatitis B Surface AntiGEN - Hepatitis C Antibody - Cervicovaginal ancillary only( [facility]) - RPR  2. Anxiety/depression:  PHQ 12 without SI/HI  - Ambulatory referral to Integrated Behavioral Health  3. Screening for diabetes mellitus History of gestational DM and elevated BMI.  - Hemoglobin A1c  4. Screening examination for STD (sexually transmitted disease) - HIV antibody (with reflex) - Hepatitis B Surface AntiGEN - Hepatitis C Antibody - Cervicovaginal ancillary only( [place]) - RPR  5. 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   - valACYclovir (VALTREX) 500 MG tablet; Take 1 tablet (500 mg total) by mouth 2 (two) times daily for 3 days.  Dispense: 6 tablet; Refill: 1  6. Encounter for contraceptive management, unspecified type Encouraged continued use of condoms and recheck UPT at home in 2-3 weeks given recent intercourse.  - norgestimate-ethinyl estradiol (ORTHO-CYCLEN) 0.25-35 MG-MCG tablet; Take 1 tablet by mouth daily.  Dispense: 28 tablet; Refill: 11   Follow up in 1 year or sooner if needed.   Leticia Penna, DO  OB Fellow, Faculty [facility], Center for [facility] 03/19/2021 3:41 PM

## 2021-03-19 NOTE — Patient Instructions (Signed)
Start the birth control pills on which day you prefer. Do a pregnancy test at home in about 2-3 weeks. Keep using condoms. Labs should be on mychart within a week, I will send a note.   You should be good for one year or sooner if needed.

## 2021-03-20 LAB — CERVICOVAGINAL ANCILLARY ONLY
Bacterial Vaginitis (gardnerella): NEGATIVE
Candida Glabrata: NEGATIVE
Candida Vaginitis: NEGATIVE
Chlamydia: NEGATIVE
Comment: NEGATIVE
Comment: NEGATIVE
Comment: NEGATIVE
Comment: NEGATIVE
Comment: NEGATIVE
Comment: NORMAL
Neisseria Gonorrhea: NEGATIVE
Trichomonas: NEGATIVE

## 2021-03-20 LAB — HIV ANTIBODY (ROUTINE TESTING W REFLEX): HIV Screen 4th Generation wRfx: NONREACTIVE

## 2021-03-20 LAB — HEPATITIS C ANTIBODY: Hep C Virus Ab: <0.1 s/co ratio (ref 0.0–0.9)

## 2021-03-20 LAB — HEPATITIS B SURFACE ANTIGEN: Hepatitis B Surface Ag: NEGATIVE

## 2021-03-20 LAB — HEMOGLOBIN A1C
Est. average glucose Bld gHb Est-mCnc: 117 mg/dL
Hgb A1c MFr Bld: 5.7 % — ABNORMAL HIGH (ref 4.8–5.6)

## 2021-03-20 LAB — RPR: RPR Ser Ql: NONREACTIVE

## 2021-04-06 IMAGING — US US MFM OB FOLLOW UP
1 series · 14 of 28 positions shown · non-contrast
Comparison: none

[Series 1: us mfm ob follow up · 71 acquisitions, 14 frames shown]
[im 3/71]
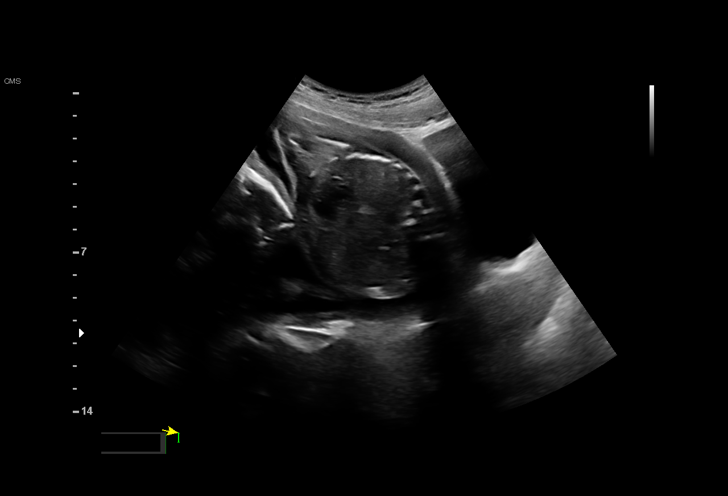
[im 8/71]
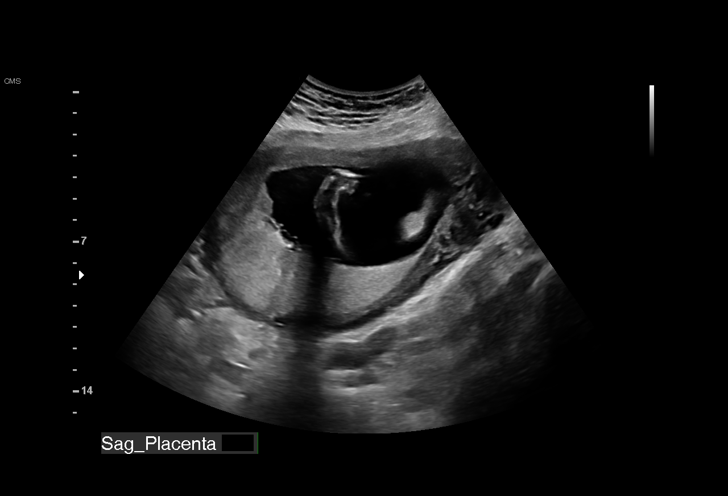
[im 13/71]
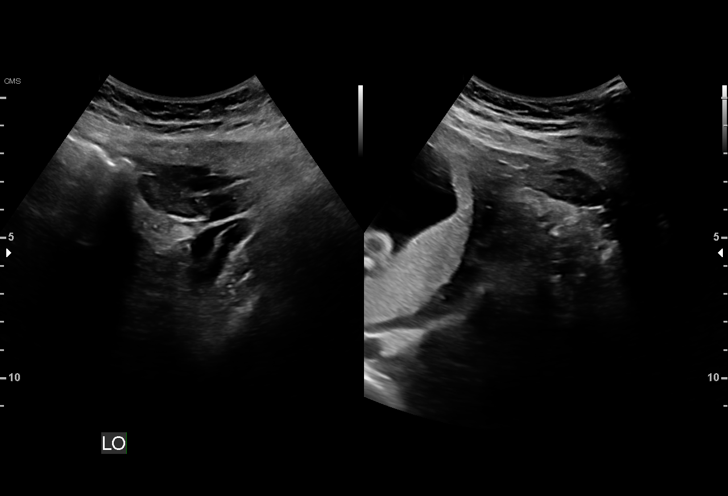
[im 19/71]
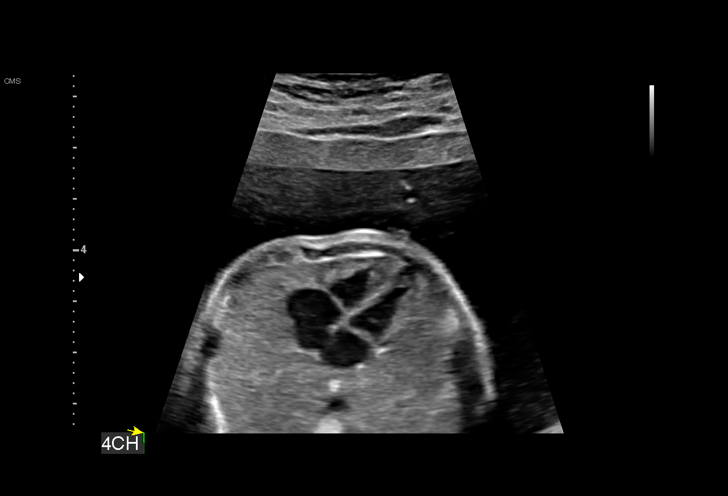
[im 24/71]
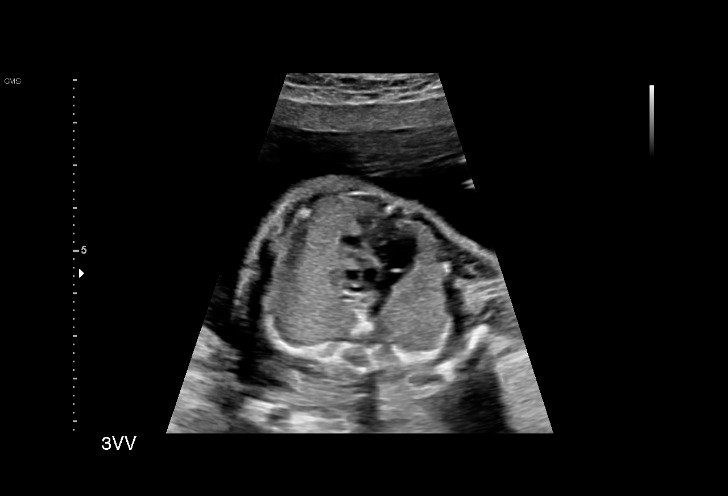
[im 29/71]
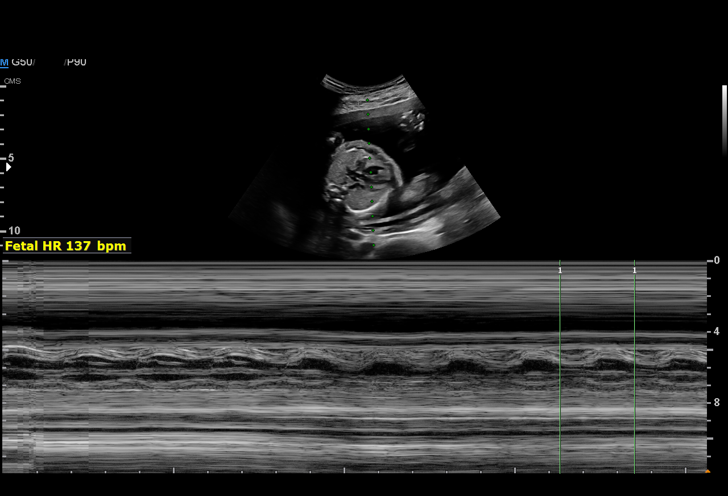
[im 34/71]
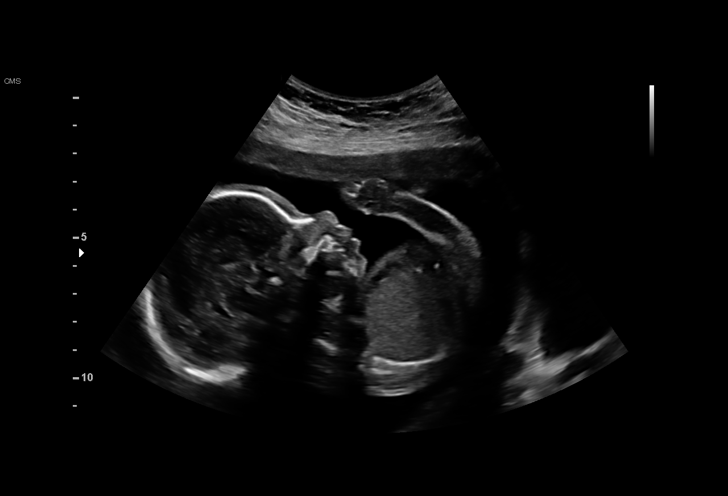
[im 39/71]
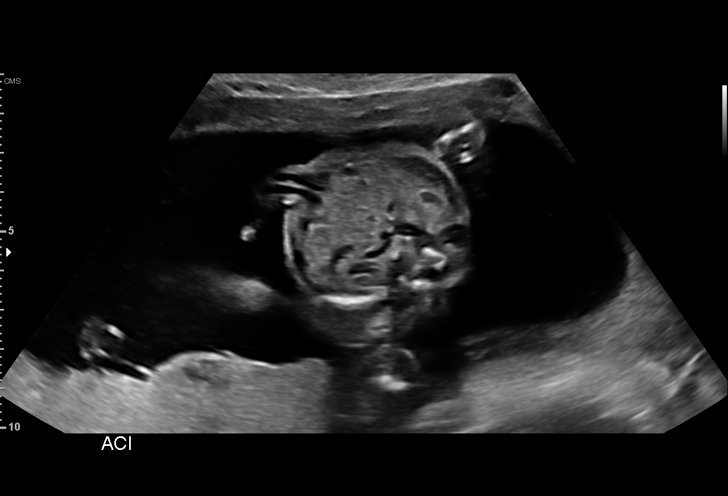
[im 45/71]
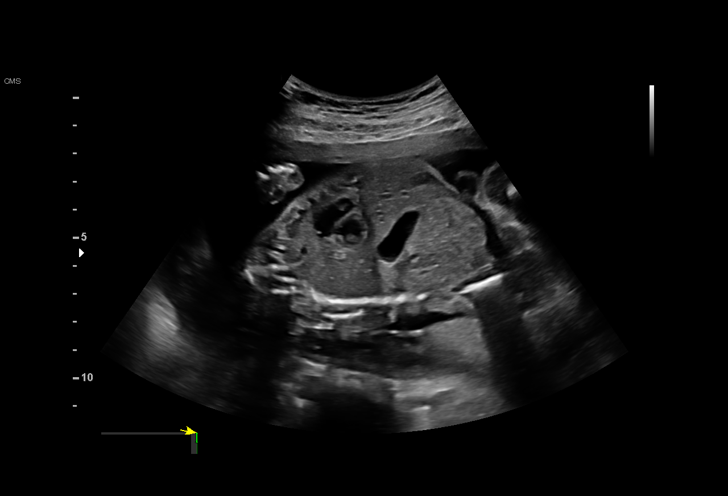
[im 50/71]
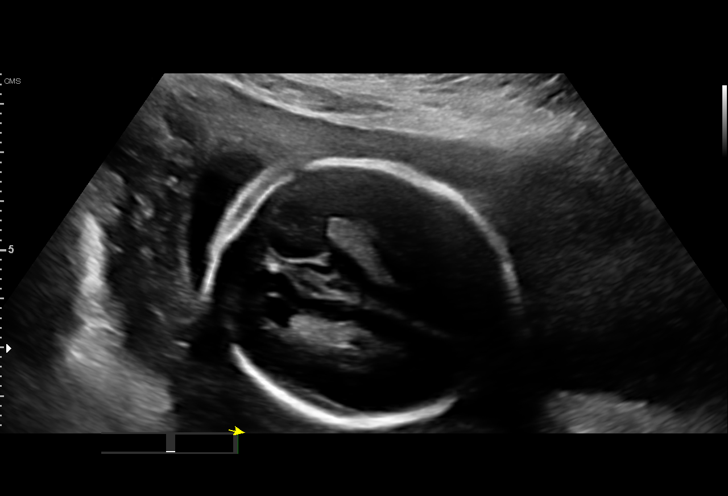
[im 55/71]
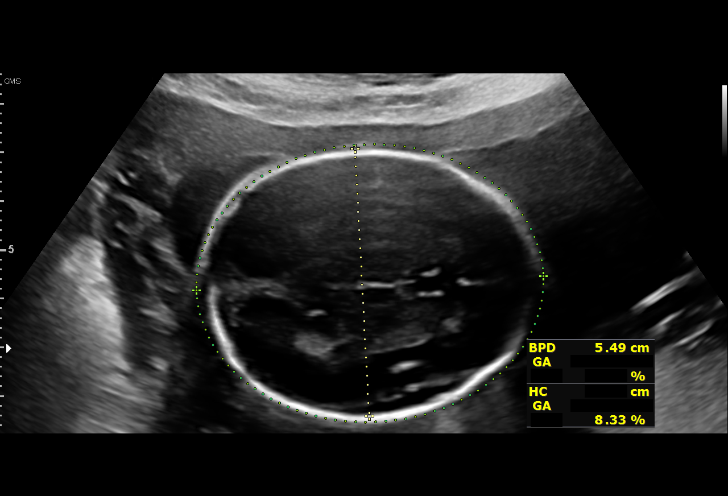
[im 60/71]
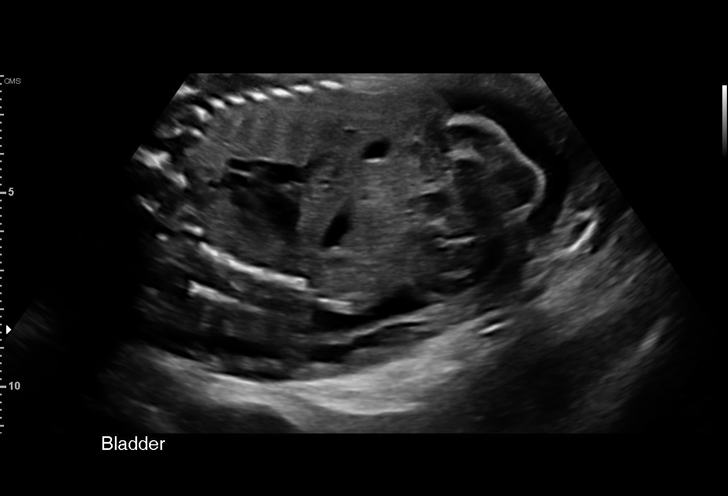
[im 65/71]
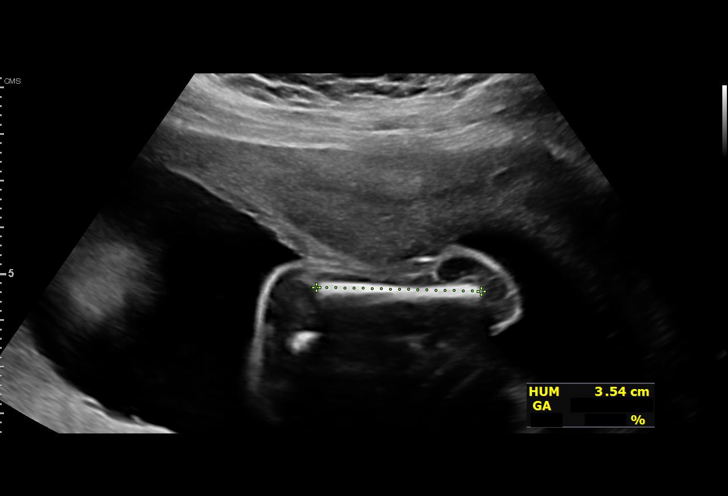
[im 71/71]
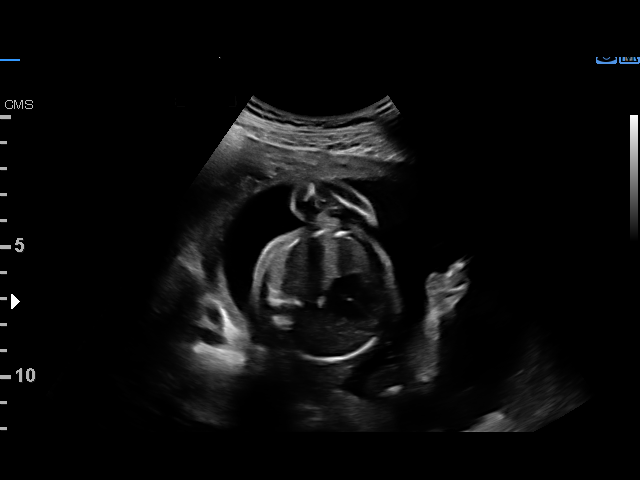

[14 of 28 positions shown; findings below may reference images not displayed]

----------------------------------------------------------------------

 ----------------------------------------------------------------------
Indications

  23 weeks gestation of pregnancy
  Antenatal follow-up for nonvisualized fetal
  anatomy
 ----------------------------------------------------------------------
Vital Signs

                                                Height:        5'3"
Fetal Evaluation

 Num Of Fetuses:         1
 Fetal Heart Rate(bpm):  137
 Cardiac Activity:       Observed
 Presentation:           Breech
 Placenta:               Posterior
 P. Cord Insertion:      Visualized
Biometry

 BPD:      54.9  mm     G. Age:  22w 5d         25  %    CI:        74.17   %    70 - 86
                                                         FL/HC:      18.5   %    19.2 -
 HC:      202.4  mm     G. Age:  22w 3d          8  %    HC/AC:      1.03        1.05 -
 AC:      195.8  mm     G. Age:  24w 2d         73  %    FL/BPD:     68.1   %    71 - 87
 FL:       37.4  mm     G. Age:  21w 6d          7  %    FL/AC:      19.1   %    20 - 24
 HUM:      34.9  mm     G. Age:  22w 0d         15  %

 Est. FW:     566  gm      1 lb 4 oz     35  %
OB History

 Gravidity:    1         Term:   0
 Living:       0
Gestational Age

 LMP:           23w 2d        Date:  08/13/18                 EDD:   05/20/19
 U/S Today:     22w 6d                                        EDD:   05/23/19
 Best:          23w 2d     Det. By:  LMP  (08/13/18)          EDD:   05/20/19
Anatomy

 Cranium:               Appears normal         Aortic Arch:            Appears normal
 Cavum:                 Appears normal         Ductal Arch:            Appears normal
 Ventricles:            Appears normal         Diaphragm:              Appears normal
 Choroid Plexus:        Appears normal         Stomach:                Appears normal, left
                                                                       sided
 Cerebellum:            Appears normal         Abdomen:                Appears normal
 Posterior Fossa:       Appears normal         Abdominal Wall:         Appears nml (cord
                                                                       insert, abd wall)
 Nuchal Fold:           Previously seen        Cord Vessels:           Appears normal (3
                                                                       vessel cord)
 Face:                  Appears normal         Kidneys:                Appear normal
                        (orbits and profile)
 Lips:                  Appears normal         Bladder:                Appears normal
 Thoracic:              Appears normal         Spine:                  Appears normal
 Heart:                 Appears normal         Upper Extremities:      Previously seen
                        (4CH, axis, and
                        situs)
 RVOT:                  Appears normal         Lower Extremities:      Previously seen
 LVOT:                  Appears normal

 Other:  Heels and hands visualized. Nasal bone visualized.
Cervix Uterus Adnexa

 Cervix
 Length:            4.1  cm.
 Normal appearance by transabdominal scan.

 Uterus
 No abnormality visualized.

 Left Ovary
 Within normal limits.

 Right Ovary
 Within normal limits.

 Cul De Sac
 No free fluid seen.

 Adnexa
 No abnormality visualized.
Impression

 Patient returned for completion of fetal anatomy. Amniotic
 fluid is normal and good fetal activity is seen. Fetal biometry
 is consistent with her previously-established dates. Fetal
 anatomical survey was completed and appears normal.
Recommendations

 Follow-up scans as clinically indicated.
                 Del Moral, Meeka

## 2021-05-15 NOTE — Telephone Encounter (Signed)
[facility] intern left HIPPA-compliant message to call back Jamie from [facility] at  [phone] (Jamie's office).   

## 2021-06-07 IMAGING — US US MFM OB COMP + 14 WK
1 series · 13 of 28 positions shown · non-contrast
Comparison: none

[Series 1: us mfm ob comp + 14 wk · 13 of 84 slices shown]
[im 4/84]
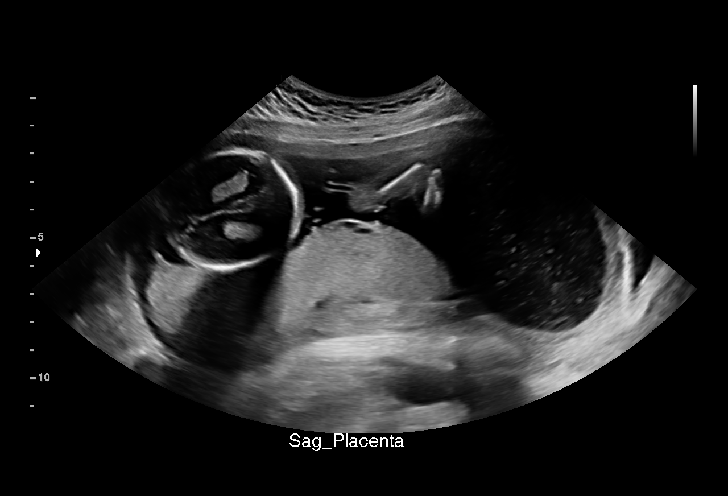
[im 10/84]
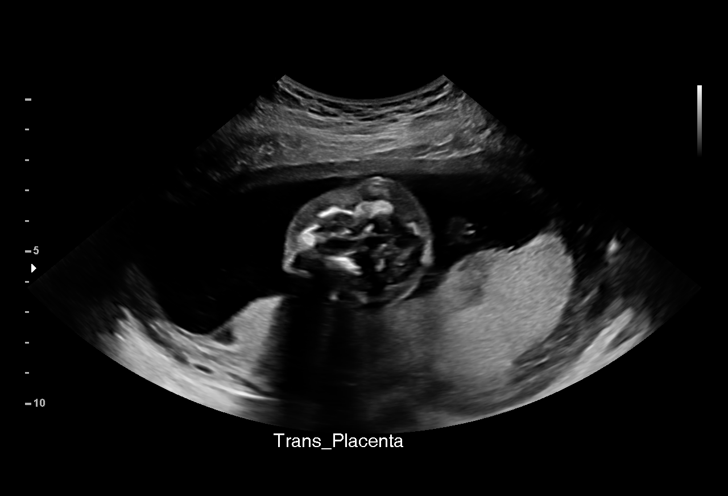
[im 16/84]
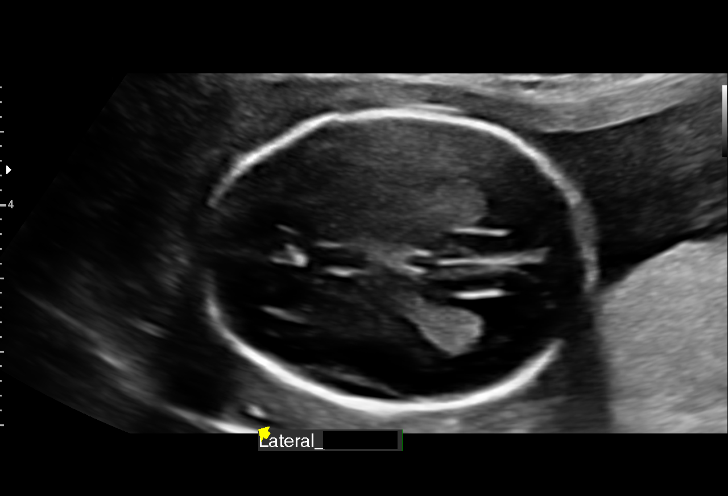
[im 22/84]
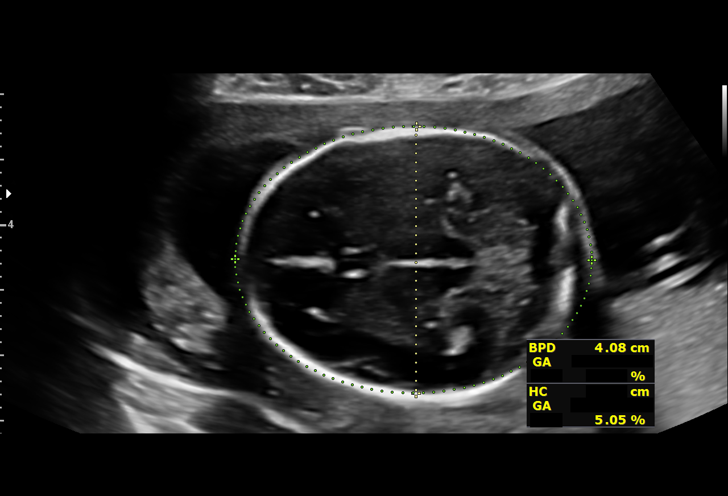
[im 28/84]
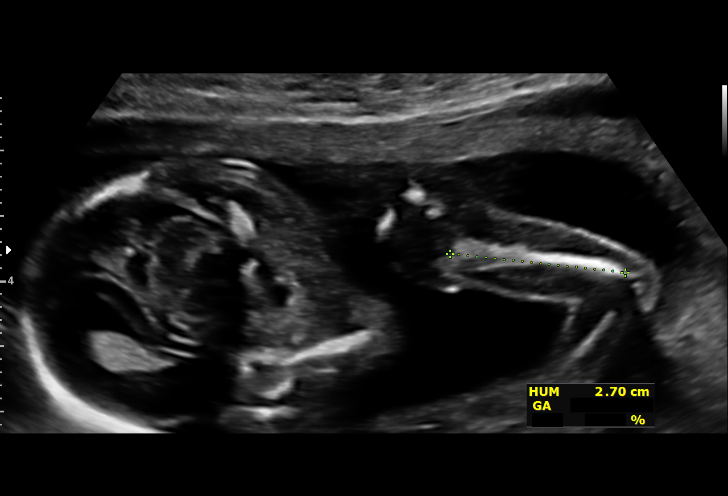
[im 34/84]
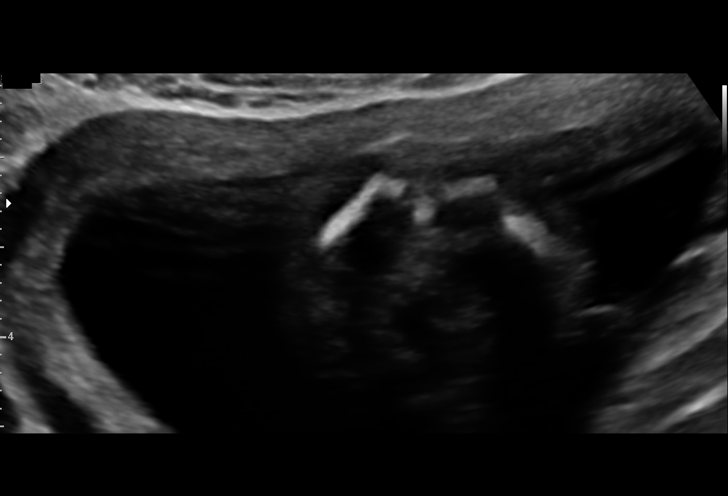
[im 44/84]
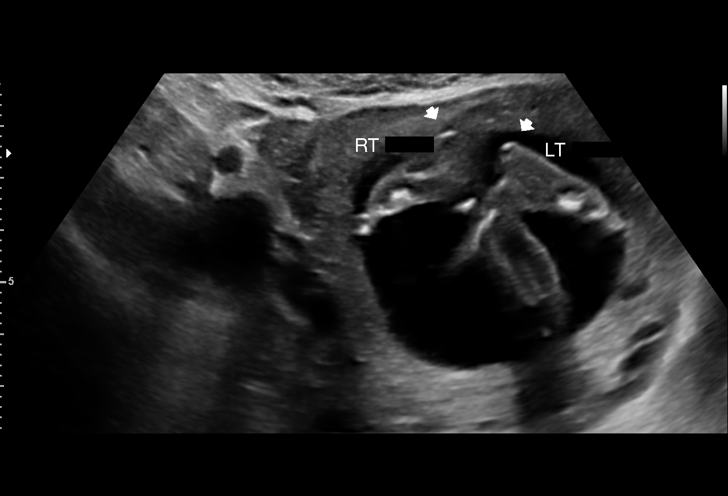
[im 50/84]
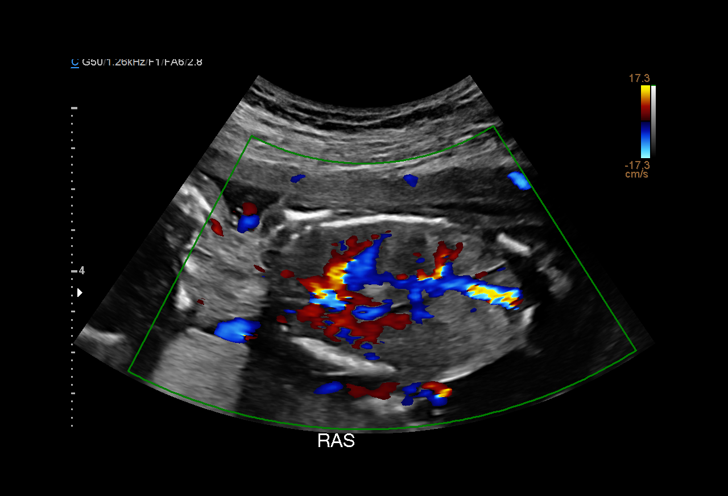
[im 56/84]
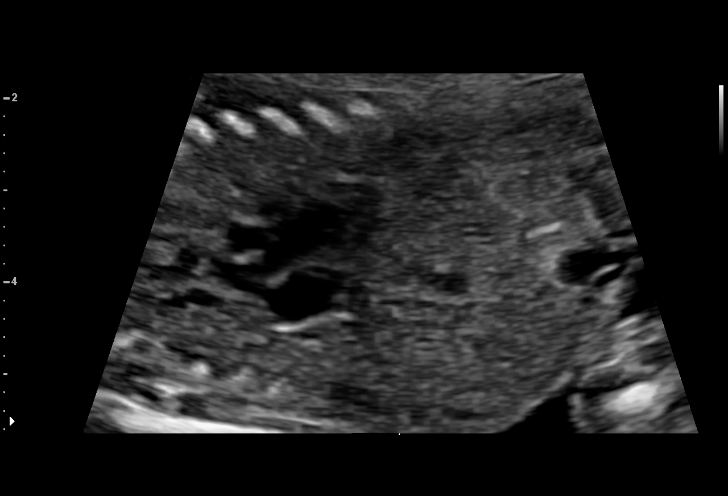
[im 62/84]
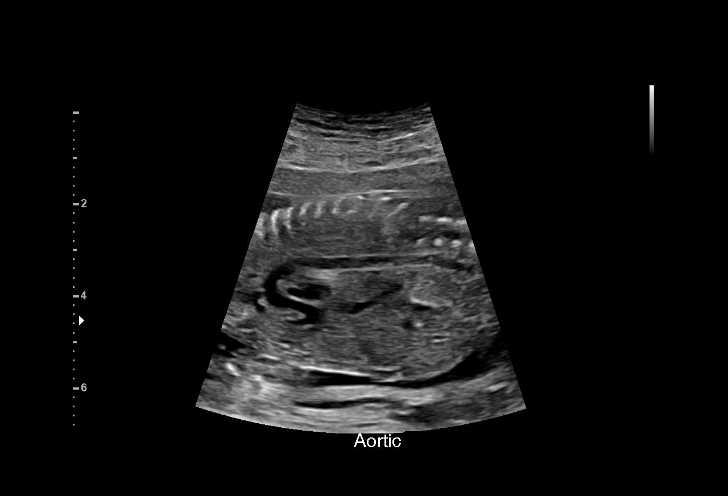
[im 68/84]
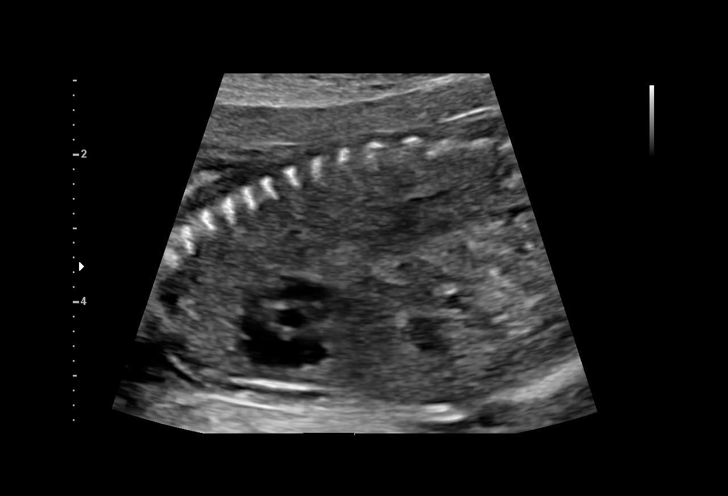
[im 74/84]
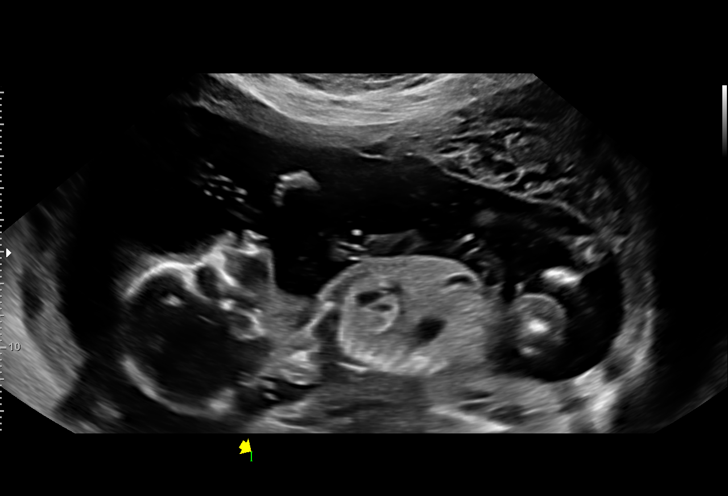
[im 80/84]
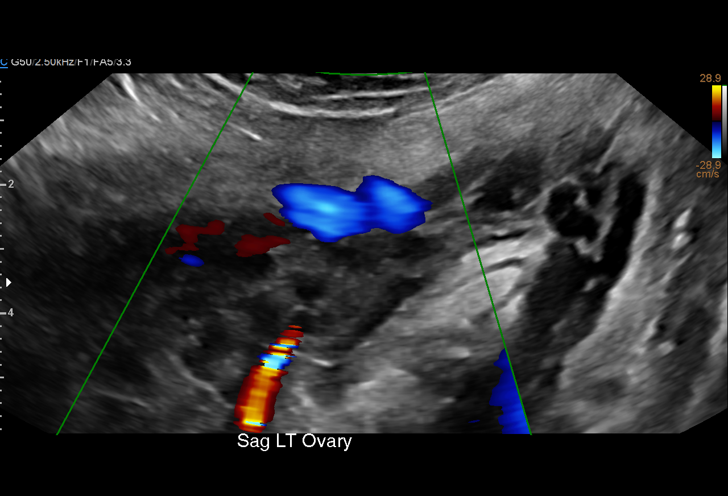

[13 of 28 positions shown; findings below may reference images not displayed]

1  US MFM OB COMP + 14 WK               76805.01     SULEYMA METAYER
 ----------------------------------------------------------------------

 ----------------------------------------------------------------------
Indications

  Encounter for antenatal screening for
  malformations
  19 weeks gestation of pregnancy
 ----------------------------------------------------------------------
Vital Signs

 BMI:
Fetal Evaluation

 Num Of Fetuses:         1
 Fetal Heart Rate(bpm):  141
 Cardiac Activity:       Observed
 Presentation:           Breech
 Placenta:               Posterior
 P. Cord Insertion:      Visualized

 Amniotic Fluid
 AFI FV:      Within normal limits

                             Largest Pocket(cm)

Biometry

 BPD:      40.8  mm     G. Age:  18w 2d         15  %    CI:        73.34   %    70 - 86
                                                         FL/HC:      17.5   %    16.1 -
 HC:      151.4  mm     G. Age:  18w 1d          6  %    HC/AC:      1.11        1.09 -
 AC:      136.3  mm     G. Age:  19w 0d         39  %    FL/BPD:     65.0   %
 FL:       26.5  mm     G. Age:  18w 0d          9  %    FL/AC:      19.4   %    20 - 24
 HUM:      26.6  mm     G. Age:  18w 3d         28  %
 CER:        19  mm     G. Age:  18w 4d         29  %
 NFT:         5  mm
 LV:        6.4  mm
 CM:        4.6  mm

 Est. FW:     245  gm      0 lb 9 oz     31  %
OB History

 Gravidity:    1         Term:   0
 Living:       0
Gestational Age

 LMP:           19w 2d        Date:  08/13/18                 EDD:   05/20/19
 U/S Today:     18w 3d                                        EDD:   05/26/19
 Best:          19w 2d     Det. By:  LMP  (08/13/18)          EDD:   05/20/19
Anatomy

 Cranium:               Appears normal         Aortic Arch:            Appears normal
 Cavum:                 Appears normal         Ductal Arch:            Not well visualized
 Ventricles:            Appears normal         Diaphragm:              Appears normal
 Choroid Plexus:        Appears normal         Stomach:                Appears normal, left
                                                                       sided
 Cerebellum:            Appears normal         Abdomen:                Appears normal
 Posterior Fossa:       Appears normal         Abdominal Wall:         Appears nml (cord
                                                                       insert, abd wall)
 Nuchal Fold:           Appears normal         Cord Vessels:           Appears normal (3
                                                                       vessel cord)
 Face:                  Appears normal         Kidneys:                Appear normal
                        (orbits and profile)
 Lips:                  Appears normal         Bladder:                Appears normal
 Thoracic:              Appears normal         Spine:                  Limited views
                                                                       appear normal
 Heart:                 Not well visualized    Upper Extremities:      Appears normal
 RVOT:                  Not well visualized    Lower Extremities:      Appears normal
 LVOT:                  Appears normal
Cervix Uterus Adnexa

 Cervix
 Length:            4.1  cm.
 Normal appearance by transabdominal scan.

 Left Ovary
 Within normal limits.

 Right Ovary
 Within normal limits.

 Cul De Sac
 No free fluid seen.

 Adnexa
 No abnormality visualized. No adnexal mass
 visualized. No free fluid.
Impression

 We performed fetal anatomy scan. No makers of
 aneuploidies or fetal structural defects are seen. Fetal
 biometry is consistent with her previously-established dates.
 Amniotic fluid is normal and good fetal activity is seen.
 Patient understands the limitations of ultrasound in detecting
 fetal anomalies.
 Patient had opted not to screen for fetal aneuploidies.
Recommendations

 An appointment was made for her to return in 4 weeks for
 completion of fetal anatomy.
                 Pepito, Dil Kumar

## 2021-07-22 ENCOUNTER — Telehealth: Payer: Self-pay | Admitting: Clinical

## 2021-07-22 NOTE — Telephone Encounter (Signed)
Attempt to schedule, per referral; Left HIPPA-compliant message to call back Lavena Loretto from [facility] at  [phone] (Terrah Decoster's office); left MyChart message for pt. °

## 2021-10-16 IMAGING — US US MFM FETAL BPP W/O NON-STRESS
1 series · 15 of 28 positions shown · non-contrast
Comparison: none

[Series 1: us mfm fetal bpp w/o non-stress · 44 acquisitions, 15 frames shown]
[im 1/44]
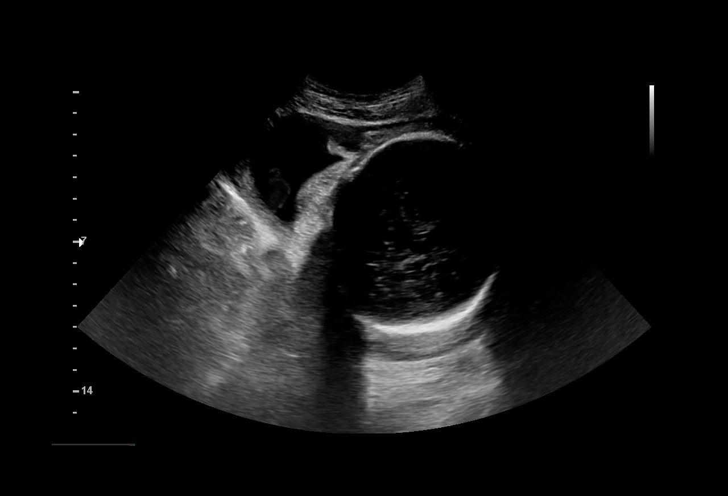
[im 4/44]
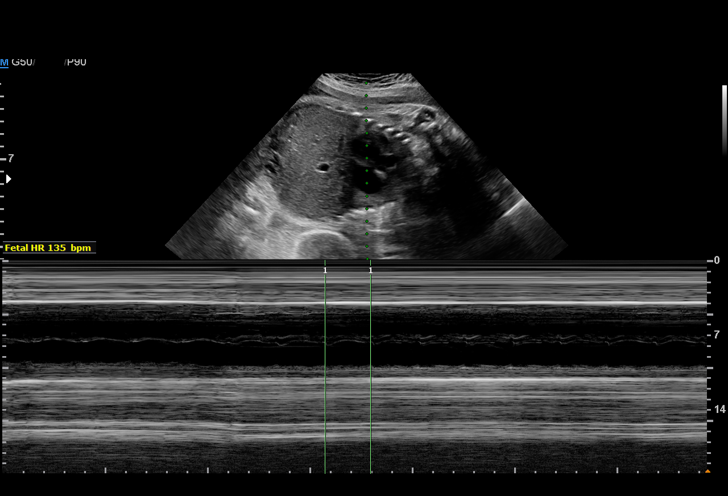
[im 7/44]
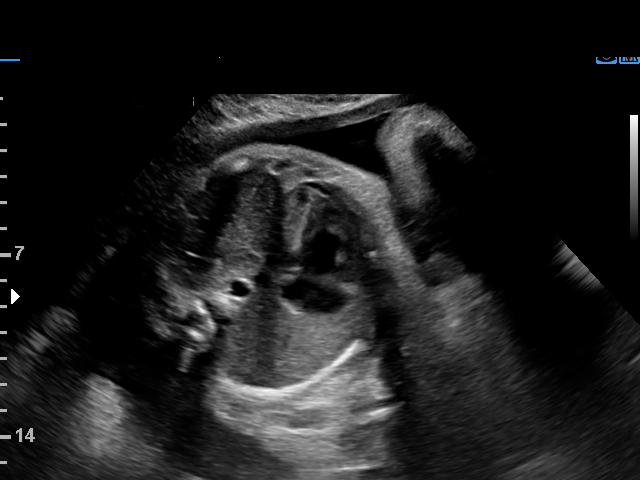
[im 10/44]
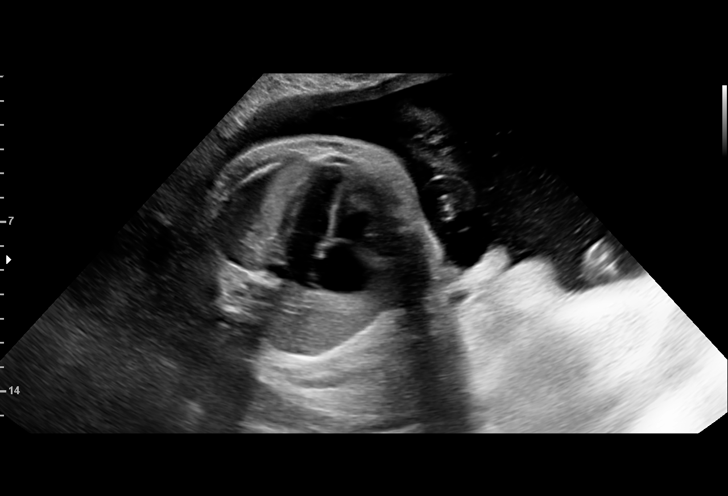
[im 13/44]
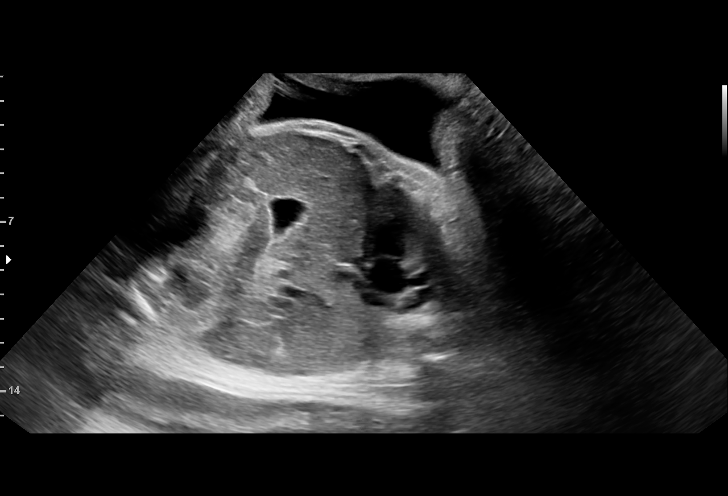
[im 16/44]
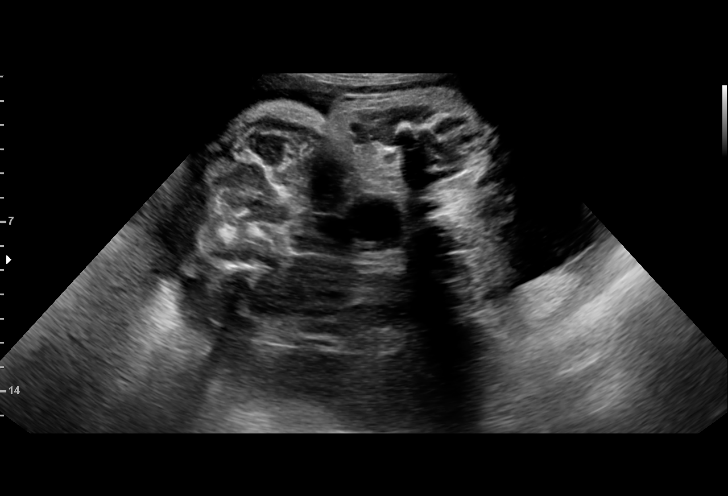
[im 20/44]
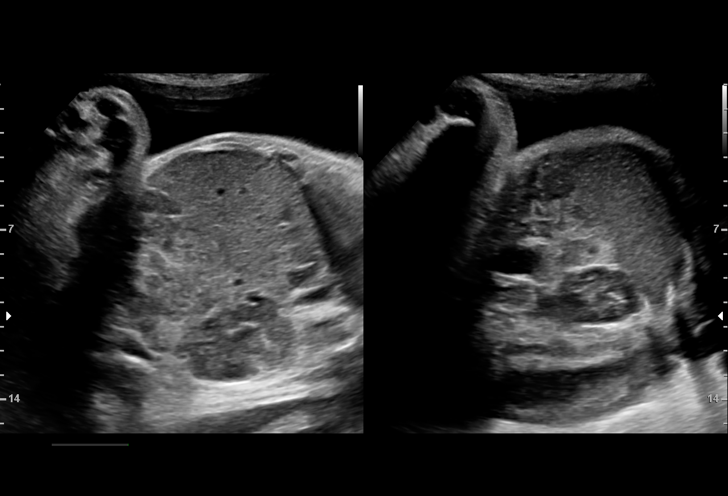
[im 23/44]
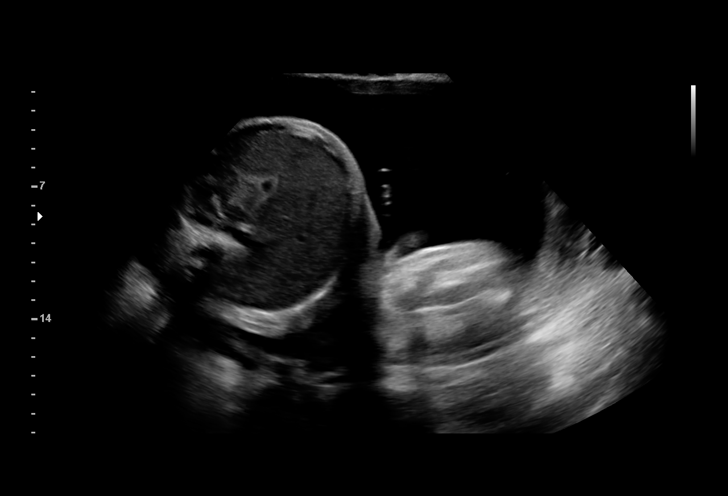
[im 24/44]
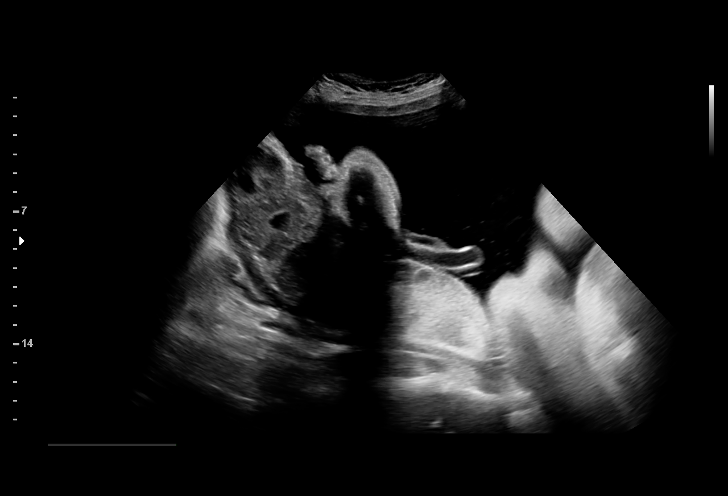
[im 28/44]
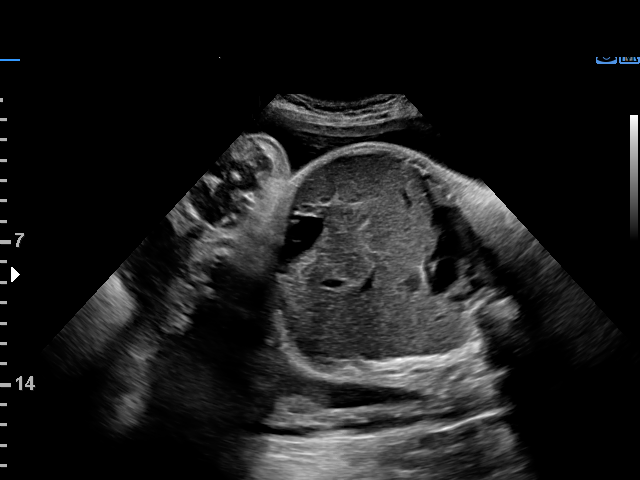
[im 31/44]
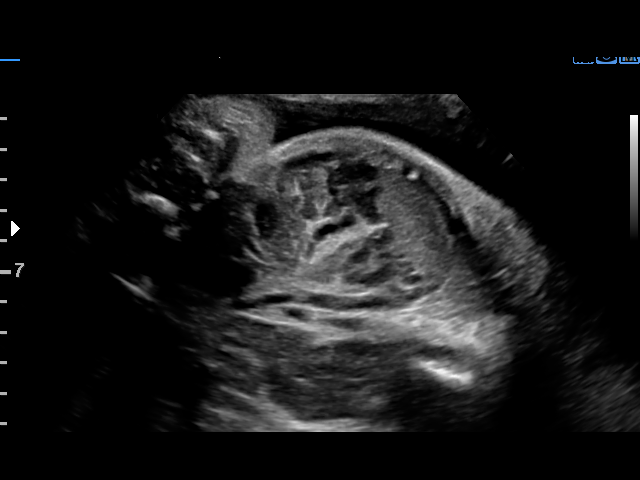
[im 34/44]
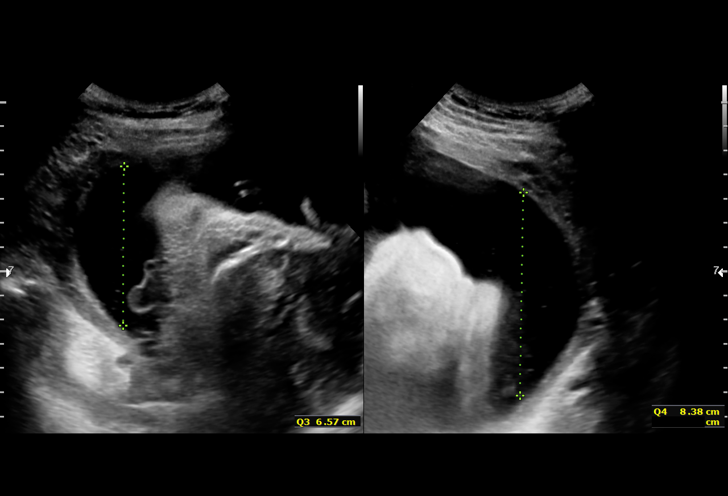
[im 37/44]
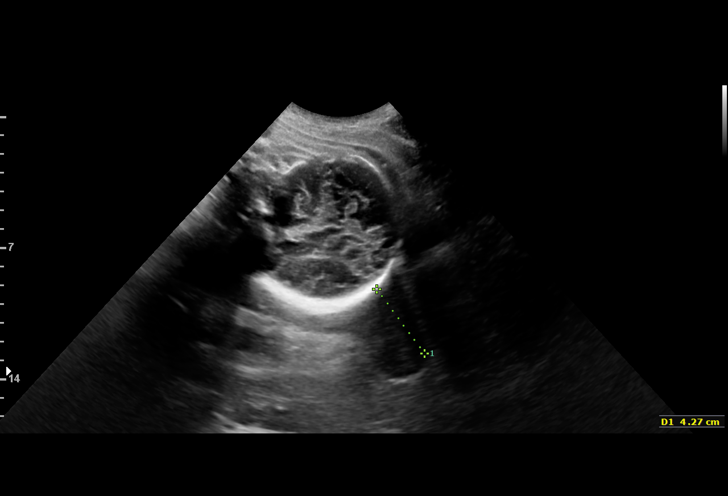
[im 40/44]
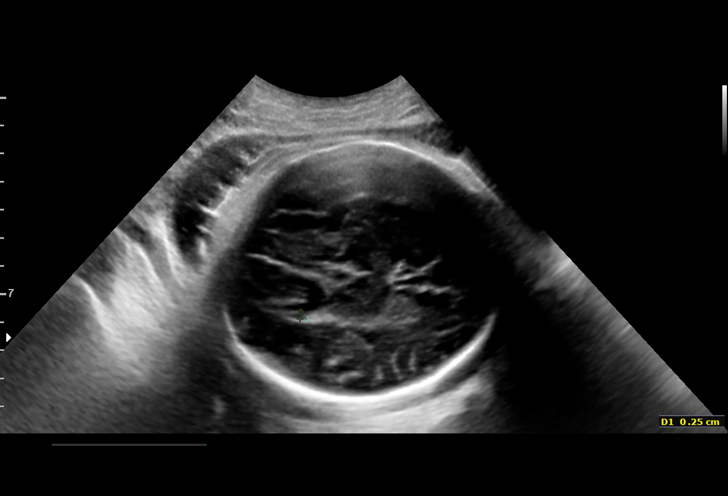
[im 44/44]
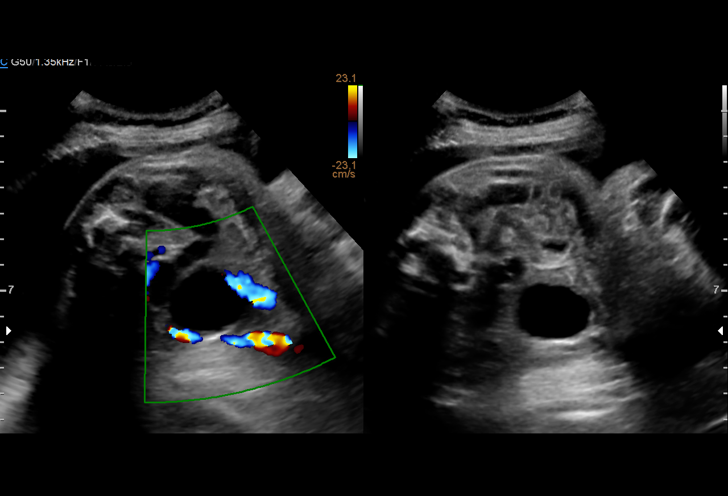

[15 of 28 positions shown; findings below may reference images not displayed]

MAU/Triage
 Attending:        Gelson Daniel Wenzi        Tertiary Phy.:     NELLINKA MODRA
 Ref. Address:     Faculty                [facility]

 ----------------------------------------------------------------------

 ----------------------------------------------------------------------
Indications

  Decreased fetal movements, third trimester,
  unspecified
  38 weeks gestation of pregnancy
  Gestational diabetes in pregnancy, diet
  controlled
 ----------------------------------------------------------------------
Vital Signs

                                                Height:        5'3"
Fetal Evaluation

 Num Of Fetuses:          1
 Fetal Heart Rate(bpm):   146
 Cardiac Activity:        Observed
 Presentation:            Cephalic
 Placenta:                Posterior
 P. Cord Insertion:       Previously Visualized

 Amniotic Fluid
 AFI FV:      Polyhydramnios

 AFI Sum(cm)     %Tile       Largest Pocket(cm)
 29.24           > 97        11

 RUQ(cm)       RLQ(cm)       LUQ(cm)        LLQ(cm)

Biophysical Evaluation

 Amniotic F.V:   Within normal limits       F. Tone:         Observed
 F. Movement:    Observed                   Score:           [DATE]
 F. Breathing:   Observed
OB History

 Gravidity:    1         Term:   0
 Living:       0
Gestational Age

 LMP:           38w 0d        Date:  08/13/18                 EDD:   05/20/19
 Best:          38w 0d     Det. By:  LMP  (08/13/18)          EDD:   05/20/19
Anatomy

 Ventricles:            Appears normal         Diaphragm:              Appears normal
 Face:                  Profile appears        Stomach:                Appears normal, left
                        normal                                         sided
 Lips:                  Appears normal         Kidneys:                Appear normal
 Heart:                 Appears normal         Bladder:                Appears normal
                        (4CH, axis, and
                        situs)
Cervix Uterus Adnexa

 Cervix
 Length:           4.27  cm.
 Normal appearance by transabdominal scan.
Impression

 Patient was evaluated for c/o decreased fetal movements.
 Gestational diabetes. Well-controlled on diet.
 On ultrasound, polyhydramnios is seen. Antenatal testing is
 reassuring. BPP [DATE].
Recommendations

 -Patient has a follow-up appointment on 05/09/19 with MFM.
 -Fetal growth and BPP on 05/09/19.
                 Brawley, Hem

## 2022-05-30 ENCOUNTER — Telehealth: Payer: Medicaid Other | Admitting: Physician Assistant

## 2022-05-30 DIAGNOSIS — A6004 Herpesviral vulvovaginitis: Secondary | ICD-10-CM | POA: Diagnosis not present

## 2022-05-30 MED ORDER — VALACYCLOVIR HCL 500 MG PO TABS: 500.0000 mg | ORAL_TABLET | Freq: Two times a day (BID) | ORAL | 0 refills | Status: AC

## 2022-05-30 NOTE — Patient Instructions (Signed)
Sierra Wu, thank you for joining Sierra Loveless, PA-C for today's virtual visit.  While this provider is not your primary care provider (PCP), if your PCP is located in our provider database this encounter information will be shared with them immediately following your visit.   A [facility] MyChart account gives you access to today's visit and all your visits, tests, and labs performed at [facility] " click here if you don't have a [facility] MyChart account or go to [email or web address]  Consent: (Patient) Sierra Wu provided verbal consent for this virtual visit at the beginning of the encounter.  Current Medications:  Current Outpatient Medications:    valACYclovir (VALTREX) 500 MG tablet, Take 1 tablet (500 mg total) by mouth 2 (two) times daily for 5 days., Disp: 10 tablet, Rfl: 0   Medications ordered in this encounter:  Meds ordered this encounter  Medications   valACYclovir (VALTREX) 500 MG tablet    Sig: Take 1 tablet (500 mg total) by mouth 2 (two) times daily for 5 days.    Dispense:  10 tablet    Refill:  0    Order Specific Question:   Supervising Provider    Answer:   Merrilee Jansky [id number]     *If you need refills on other medications prior to your next appointment, please contact your pharmacy*  Follow-Up: Call back or seek an in-person evaluation if the symptoms worsen or if the condition fails to improve as anticipated.   Virtual Care [phone]  Other Instructions  Genital Herpes Genital herpes is a common sexually transmitted infection (STI) that is caused by a virus. The virus spreads from person to person through contact with a sore, infected saliva, or infected skin. The virus can cause itching, blisters, and sores around the genitals or rectum. During an outbreak of infection, symptoms may last for several days and then go away. However, the virus remains in the body, so more outbreaks may happen in the  future. The time between outbreaks varies and can be from months to years. Genital herpes can affect anyone. It is particularly concerning for pregnant women because the virus can be passed to the baby during delivery. Genital herpes is also a concern for people who have a weak disease-fighting system (immune system). What are the causes? This condition is caused by the herpes simplex virus, type 1 or type 2 (HSV-1 or HSV-2). The virus may spread through: Sexual contact with an infected person, including vaginal, anal, and oral sex. Contact with a herpes sore. The skin. This means that you can get herpes from an infected partner even if there are no blisters or sores present. Your partner may not know that he or she is infected. What increases the risk? You are more likely to develop this condition if: You have sex with many partners. You do not use latex or polyurethane condoms during sex. What are the signs or symptoms? Most people do not have symptoms or they have mild symptoms that may be mistaken for other skin problems. Symptoms may include: Small, red bumps near the genitals, rectum, or mouth. These bumps turn into blisters and then sores. Flu-like (influenza-like) symptoms, including: Fever. Body aches. Swollen lymph nodes. Headache. Painful urination. Pain and itching in the genital area or rectal area. Vaginal discharge. Tingling or shooting pain in the legs and buttocks. Generally, symptoms are more severe and last longer during the first (primary) outbreak. Influenza-like symptoms are also more common during the primary  outbreak. How is this diagnosed? This condition may be diagnosed based on: A physical exam. Your medical history. Blood tests. A test of a fluid sample (culture) from an open sore. How is this treated? There is no cure for this condition, but treatment with antiviral medicines can do the following: Speed up healing and relieve symptoms. Help to reduce the  spread of the virus to sexual partners. Limit the chance of future outbreaks, or make future outbreaks shorter. Lessen symptoms of future outbreaks. Your health care provider may also recommend over-the-counter medicines to help with pain and itching. Follow these instructions at home: If you have an outbreak:  Keep the affected areas dry and clean. Avoid rubbing or touching blisters and sores. If you do touch blisters or sores: Wash your hands thoroughly with soap and water for at least 20 seconds. If soap and water are not available, use an alcohol-based hand sanitizer. Do not touch your eyes afterward. Sexual activity Do not have sexual contact during active outbreaks. Practice safe sex. Herpes can spread even if your partner does not have blisters or sores. Latex or polyurethane condoms and female condoms may help prevent the spread of the herpes virus. Managing pain and discomfort If directed, put ice on the painful area. To do this: Put ice in a plastic bag. Place a towel between your skin and the bag. Leave the ice on for 20 minutes, 2-3 times a day. Remove the ice if your skin turns bright red. This is very important. If you cannot feel pain, heat, or cold, you have a greater risk of damage to the area. If told, take a cool sitz bath to help relieve pain or itching. A sitz bath is a water bath that you take while sitting down in water that is deep enough to cover your hips and buttocks. General instructions Take over-the-counter and prescription medicines only as told by your health care provider. If you were prescribed an antiviral medicine, use it as told by your health care provider. Do not stop using the antiviral even if you start to feel better. Keep all follow-up visits. This is important. How is this prevented? Use condoms. Although you can get genital herpes during sexual contact even with the use of a condom, a condom can provide some protection. Avoid having multiple  sexual partners. Talk with your sexual partner about any symptoms either of you may have. Also, talk with your partner about any history of STIs. Do not have sexual contact if you have active symptoms of genital herpes. Contact a health care provider if: Your symptoms are not improving with medicine. Your symptoms return, or you have new symptoms. You have a fever. You have abdominal pain. You have redness, swelling, or pain in your eye. You notice new sores on other parts of your body. You have had herpes and you become pregnant or plan to become pregnant. Get help right away if: You have symptoms of viral meningitis. This is rare but may happen if the virus spreads to the brain. Symptoms may include: Severe headache or stiff neck. Muscle aches. Nausea and vomiting. Sensitivity to light. Summary Genital herpes is a common sexually transmitted infection (STI) that is caused by the herpes simplex virus, type 1 or type 2 (HSV-1 or HSV-2). These viruses are most often spread through sexual contact with an infected person. You are more likely to develop this condition if you have sex with many partners or you do not use condoms during sex. Most  people do not have symptoms or have mild symptoms that may be mistaken for other skin problems. Symptoms occur as outbreaks that may happen months or years apart. There is no cure for this condition, but treatment with oral antiviral medicines can reduce symptoms, reduce the chance of spreading the virus to a partner, prevent future outbreaks, or shorten future outbreaks. This information is not intended to replace advice given to you by your health care provider. Make sure you discuss any questions you have with your health care provider. Document Revised: 04/09/2021 Document Reviewed: 04/09/2021 Elsevier Patient Education  2023 Elsevier Inc.    If you have been instructed to have an in-person evaluation today at a local Urgent Care facility, please  use the link below. It will take you to a list of all of our available [facility], including address, phone number and hours of operation. Please do not delay care.  [facility]  If you or a family member do not have a primary care provider, use the link below to schedule a visit and establish care. When you choose a [facility] primary care physician or advanced practice provider, you gain a long-term partner in health. Find a Primary Care Provider  Learn more about [facility]'s in-office and virtual care options:  - Get Care Now

## 2022-05-30 NOTE — Progress Notes (Signed)
Virtual Visit Consent   Sierra Wu, you are scheduled for a virtual visit with a [facility] provider today. Just as with appointments in the office, your consent must be obtained to participate. Your consent will be active for this visit and any virtual visit you may have with one of our providers in the next 365 days. If you have a MyChart account, a copy of this consent can be sent to you electronically.  As this is a virtual visit, video technology does not allow for your provider to perform a traditional examination. This may limit your provider's ability to fully assess your condition. If your provider identifies any concerns that need to be evaluated in person or the need to arrange testing (such as labs, EKG, etc.), we will make arrangements to do so. Although advances in technology are sophisticated, we cannot ensure that it will always work on either your end or our end. If the connection with a video visit is poor, the visit may have to be switched to a telephone visit. With either a video or telephone visit, we are not always able to ensure that we have a secure connection.  By engaging in this virtual visit, you consent to the provision of healthcare and authorize for your insurance to be billed (if applicable) for the services provided during this visit. Depending on your insurance coverage, you may receive a charge related to this service.  I need to obtain your verbal consent now. Are you willing to proceed with your visit today? [name] has provided verbal consent on 05/30/2022 for a virtual visit (video or telephone). [name], PA-C  Date: 05/30/2022 12:21 PM  Virtual Visit via Video Note   I, [name], connected with  Sierra Wu  ([id number], 11/05/1995) on 05/30/22 at 12:15 PM EST by a video-enabled telemedicine application and verified that I am speaking with the correct person using two identifiers.  Location: Patient: Virtual Visit  Location Patient: Home Provider: Virtual Visit Location Provider: Home Office   I discussed the limitations of evaluation and management by telemedicine and the availability of in person appointments. The patient expressed understanding and agreed to proceed.    History of Present Illness: Sierra Wu is a 26 y.o. who identifies as a female who was assigned female at birth, and is being seen today for genital herpes. Outbreak started today, has been diagnosed in the past.    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  Resting comfortably at home.  Head is normocephalic, atraumatic.  No labored breathing.  Speech is clear and coherent with logical content.  Patient is alert and oriented at baseline.    Assessment and Plan: 1. Herpes simplex vulvovaginitis - valACYclovir (VALTREX) 500 MG tablet; Take 1 tablet (500 mg total) by mouth 2 (two) times daily for 5 days.  Dispense: 10 tablet; Refill: 0  - Valtrex prescribed - Discussed follow up with a new PCP or OB/GYN for pap and considerations of suppressive therapy; voiced understanding  Follow Up Instructions: I discussed the assessment and treatment plan with the patient. The patient was provided an opportunity to ask questions and all were  answered. The patient agreed with the plan and demonstrated an understanding of the instructions.  A copy of instructions were sent to the patient via MyChart unless otherwise noted below.    The patient was advised to call back or seek an in-person evaluation if the symptoms worsen or if the condition fails to improve as anticipated.  Time:   I spent 8 minutes with the patient via telehealth technology discussing the above problems/concerns.    [name], PA-C

## 2022-06-17 ENCOUNTER — Other Ambulatory Visit (HOSPITAL_COMMUNITY)
Admission: RE | Admit: 2022-06-17 | Discharge: 2022-06-17 | Disposition: A | Discharge status: Home / Self Care | Payer: Medicaid Other | Source: Ambulatory Visit | Attending: Obstetrics and Gynecology | Admitting: Obstetrics and Gynecology

## 2022-06-17 ENCOUNTER — Ambulatory Visit: Payer: Medicaid Other | Admitting: Obstetrics and Gynecology

## 2022-06-17 ENCOUNTER — Other Ambulatory Visit: Payer: Self-pay

## 2022-06-17 ENCOUNTER — Encounter: Payer: Self-pay | Admitting: Obstetrics and Gynecology

## 2022-06-17 VITALS — BP 114/75 | HR 83 | Ht 64.0 in | Wt 133.5 lb

## 2022-06-17 DIAGNOSIS — Z202 Contact with and (suspected) exposure to infections with a predominantly sexual mode of transmission: Secondary | ICD-10-CM

## 2022-06-17 DIAGNOSIS — Z01419 Encounter for gynecological examination (general) (routine) without abnormal findings: Secondary | ICD-10-CM | POA: Insufficient documentation

## 2022-06-17 DIAGNOSIS — Z113 Encounter for screening for infections with a predominantly sexual mode of transmission: Secondary | ICD-10-CM | POA: Insufficient documentation

## 2022-06-17 DIAGNOSIS — N63 Unspecified lump in unspecified breast: Secondary | ICD-10-CM

## 2022-06-17 MED ORDER — VALACYCLOVIR HCL 500 MG PO TABS: 500.0000 mg | ORAL_TABLET | Freq: Two times a day (BID) | ORAL | 2 refills | Status: AC

## 2022-06-17 NOTE — Progress Notes (Signed)
Sierra Wu is a 26 y.o. G11P1001 female here for a routine annual gynecologic exam.  Current complaints: breast lump. Has noted for the last week. No trauma, negative FH.   Denies abnormal vaginal bleeding, discharge, pelvic pain, problems with intercourse or other gynecologic concerns.    Gynecologic History Patient's last menstrual period was 05/20/2022 (approximate). Contraception: none Last Pap: 2020. Results were: normal Last mammogram: NA.   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   No current facility-administered medications on file prior to visit.    No Known Allergies  Social History   Socioeconomic History   Marital status: Single    Spouse name: Not on file   Number of children: Not on file   Years of education: Not on file   Highest education level: Not on file  Occupational History   Not on file  Tobacco Use   Smoking status: Never   Smokeless tobacco: Never  Vaping Use   Vaping Use: Never used  Substance and Sexual Activity   Alcohol use: No   Drug use: No   Sexual activity: Yes    Birth control/protection: None  Other Topics Concern   Not on file  Social History Narrative   Not on file   Social Determinants of Health   Financial Resource Strain: Not on file  Food Insecurity: No Food Insecurity (03/19/2021)   Hunger Vital Sign    Worried About Running Out of Food in the Last Year: Never true    Ran Out  of Food in the Last Year: Never true  Transportation Needs: No Transportation Needs (03/19/2021)   PRAPARE - 05/19/2021 (Medical): No    Lack of Transportation (Non-Medical): No  Physical Activity: Not on file  Stress: Not on file  Social Connections: Not on file  Intimate Partner Violence: Not on file    Family History  Problem Relation Age of Onset   Healthy Other     The following portions of the patient's history were reviewed and updated as appropriate: allergies, current medications, past family history, past medical history, past social history, past surgical history and problem list.  Review of Systems Pertinent items are noted in HPI.   Objective:  BP 114/75   Pulse 83   Ht 5\' 4"  (1.626 m)   Wt 133 lb 8 oz (60.6 kg)   LMP 05/20/2022 (Approximate)   BMI 22.92 kg/m  CONSTITUTIONAL: Well-developed, well-nourished female in no acute distress.  HENT:  Normocephalic, atraumatic, External right and left ear normal. Oropharynx is clear and moist EYES: Conjunctivae and EOM are normal. Pupils are equal, round, and reactive to light. No scleral icterus.  NECK: Normal range of motion, supple, no masses.  Normal thyroid.  SKIN:  Skin is warm and dry. No rash noted. Not diaphoretic. No erythema. No pallor. NEUROLGIC: Alert and oriented to person, place, and time. Normal reflexes, muscle tone coordination. No cranial nerve deficit noted. PSYCHIATRIC: Normal mood and affect. Normal behavior. Normal judgment and thought content. CARDIOVASCULAR: Normal heart rate noted, regular rhythm RESPIRATORY: Clear to auscultation bilaterally. Effort and breath sounds normal, no problems with respiration noted. BREASTS: Symmetric in size. Small pea size lump, right breast, @ 1 cm, 12 o'clock, mobile, non tender, left breast normal, no adenopathy ABDOMEN: Soft, normal bowel sounds, no distention noted.  No tenderness, rebound or guarding.  PELVIC: Normal appearing  external genitalia; normal appearing vaginal mucosa and cervix.  No abnormal discharge noted.  Pap smear obtained.  Normal uterine size, no other palpable masses, no uterine or adnexal tenderness. MUSCULOSKELETAL: Normal range of motion. No tenderness.  No cyanosis, clubbing, or edema.  2+ distal pulses.   Assessment:  Annual gynecologic examination with pap smear STD testing Breast lump Plan:  Will follow up results of pap smear and manage accordingly. STD testing as per pt request Valtrex as needed for HSV Breast U/S Routine preventative health maintenance measures emphasized. Please refer to After Visit Summary for other counseling recommendations.    Hermina Staggers, MD, FACOG Attending Obstetrician & Gynecologist [facility] for [facility], [facility]

## 2022-06-17 NOTE — Patient Instructions (Signed)

## 2022-06-17 NOTE — Progress Notes (Signed)
Pt reports lump on right breast for a week now.

## 2022-06-18 LAB — HEPATITIS C ANTIBODY: Hep C Virus Ab: NONREACTIVE

## 2022-06-18 LAB — RPR: RPR Ser Ql: NONREACTIVE

## 2022-06-18 LAB — HIV ANTIBODY (ROUTINE TESTING W REFLEX): HIV Screen 4th Generation wRfx: NONREACTIVE

## 2022-06-18 LAB — HEPATITIS B SURFACE ANTIGEN: Hepatitis B Surface Ag: NEGATIVE

## 2022-06-23 ENCOUNTER — Encounter: Payer: Self-pay | Admitting: Obstetrics and Gynecology

## 2022-06-23 DIAGNOSIS — R87619 Unspecified abnormal cytological findings in specimens from cervix uteri: Secondary | ICD-10-CM | POA: Insufficient documentation

## 2022-06-23 LAB — CYTOLOGY - PAP
Chlamydia: NEGATIVE
Comment: NEGATIVE
Comment: NEGATIVE
Comment: NEGATIVE
Comment: NORMAL
Diagnosis: UNDETERMINED — AB
High risk HPV: NEGATIVE
Neisseria Gonorrhea: NEGATIVE
Trichomonas: NEGATIVE

## 2022-07-08 ENCOUNTER — Other Ambulatory Visit: Payer: Medicaid Other

## 2022-07-29 ENCOUNTER — Ambulatory Visit: Payer: Medicaid Other | Admitting: Obstetrics and Gynecology

## 2022-07-30 ENCOUNTER — Ambulatory Visit
Admission: RE | Admit: 2022-07-30 | Discharge: 2022-07-30 | Disposition: A | Discharge status: Home / Self Care | Payer: Medicaid Other | Source: Ambulatory Visit | Attending: Obstetrics and Gynecology | Admitting: Obstetrics and Gynecology

## 2022-07-30 DIAGNOSIS — N63 Unspecified lump in unspecified breast: Secondary | ICD-10-CM

## 2022-07-30 DIAGNOSIS — N644 Mastodynia: Secondary | ICD-10-CM | POA: Diagnosis not present

## 2022-09-08 ENCOUNTER — Other Ambulatory Visit: Payer: Self-pay

## 2022-09-08 ENCOUNTER — Emergency Department (HOSPITAL_COMMUNITY): Payer: Medicaid Other

## 2022-09-08 ENCOUNTER — Emergency Department (HOSPITAL_COMMUNITY)
Admission: EM | Admit: 2022-09-08 | Discharge: 2022-09-08 | Disposition: A | Discharge status: Home / Self Care | Payer: Medicaid Other | Attending: Emergency Medicine | Admitting: Emergency Medicine

## 2022-09-08 DIAGNOSIS — R0789 Other chest pain: Secondary | ICD-10-CM | POA: Diagnosis not present

## 2022-09-08 DIAGNOSIS — R079 Chest pain, unspecified: Secondary | ICD-10-CM | POA: Diagnosis not present

## 2022-09-08 LAB — TROPONIN I (HIGH SENSITIVITY): Troponin I (High Sensitivity): <2 ng/L (ref <18)

## 2022-09-08 LAB — CBC
HCT: 39.2 % (ref 36.0–46.0)
Hemoglobin: 12.5 g/dL (ref 12.0–15.0)
MCH: 29.1 pg (ref 26.0–34.0)
MCHC: 31.9 g/dL (ref 30.0–36.0)
MCV: 91.2 fL (ref 80.0–100.0)
Platelets: 269 10*3/uL (ref 150–400)
RBC: 4.3 MIL/uL (ref 3.87–5.11)
RDW: 13.2 % (ref 11.5–15.5)
WBC: 9.4 10*3/uL (ref 4.0–10.5)
nRBC: 0 % (ref 0.0–0.2)

## 2022-09-08 LAB — BASIC METABOLIC PANEL
Anion gap: 9 (ref 5–15)
BUN: 10 mg/dL (ref 6–20)
CO2: 25 mmol/L (ref 22–32)
Calcium: 9.2 mg/dL (ref 8.9–10.3)
Chloride: 102 mmol/L (ref 98–111)
Creatinine, Ser: 0.72 mg/dL (ref 0.44–1.00)
GFR, Estimated: >60 mL/min (ref >60)
Glucose, Bld: 59 mg/dL — ABNORMAL LOW (ref 70–99)
Potassium: 3.4 mmol/L — ABNORMAL LOW (ref 3.5–5.1)
Sodium: 136 mmol/L (ref 135–145)

## 2022-09-08 MED ORDER — IBUPROFEN 200 MG PO TABS
600.0000 mg | ORAL_TABLET | Freq: Once | ORAL | Status: AC
  Administered 2022-09-08: 600 mg via ORAL
  Filled 2022-09-08: qty 3

## 2022-09-08 NOTE — ED Notes (Signed)
Pt seen walking out w/o being dc'd

## 2022-09-08 NOTE — ED Provider Triage Note (Signed)
Emergency Medicine Provider Triage Evaluation Note  Sierra Wu , a 27 y.o. female  was evaluated in triage.  Pt complains of chest pain that started yesterday.  Constant, sharp, located on the left side of the chest, worse with deep breathing and positional changes.  No fever nausea vomiting bowel changes or urinary symptoms.  Denies extremity weakness or numbness.  Review of Systems  Positive: As above Negative: As above  Physical Exam  BP 120/79 (BP Location: Right Arm)   Pulse 78   Temp 98.5 F (36.9 C) (Oral)   Resp 19   Wt 60 kg   SpO2 100%   BMI 22.71 kg/m  Gen:   Awake, no distress   Resp:  Normal effort  MSK:   Moves extremities without difficulty  Other:    Medical Decision Making  Medically screening exam initiated at 6:42 PM.  Appropriate orders placed.  Judith Part was informed that the remainder of the evaluation will be completed by another provider, this initial triage assessment does not replace that evaluation, and the importance of remaining in the ED until their evaluation is complete.    Rex Kras, [place] 09/09/22 0030

## 2022-09-08 NOTE — Discharge Instructions (Signed)
Return for any problem.  ?

## 2022-09-08 NOTE — ED Triage Notes (Addendum)
Centralized cp worse with bending over, coughing, and fast movement X1 day. Denies radiaiton Also c/o runny nose.  Denies sob.

## 2022-09-08 NOTE — ED Provider Notes (Signed)
[place] EMERGENCY DEPARTMENT AT [facility] Provider Note   CSN: [id number] Arrival date & time: 09/08/22  1751     History  Chief Complaint  Patient presents with   Chest Pain    Sierra Wu is a 27 y.o. female.  27 year old female with prior medical history as detailed below presents for evaluation.  Patient complains of URI symptoms -including mild cough, congestion, runny nose.  Symptoms began approximately 2 to 3 days ago.  Patient reports that with coughing she has vague anterior chest wall pain.  She denies chest pain without cough.  She denies shortness of breath.  She denies other complaint.  She denies fever.  She reports known sick contacts with similar symptoms.  The history is provided by the patient and medical records.       Home Medications Prior to Admission medications   Medication Sig Start Date End Date Taking? Authorizing Provider  valACYclovir (VALTREX) 500 MG tablet Take 1 tablet (500 mg total) by mouth 2 (two) times daily. 06/17/22   Chancy Milroy, MD      Allergies    Patient has no known allergies.    Review of Systems   Review of Systems  All other systems reviewed and are negative.   Physical Exam Updated Vital Signs BP 115/82   Pulse 88   Temp 98.5 F (36.9 C) (Oral)   Resp 17   Wt 60 kg   LMP 08/08/2022   SpO2 100%   BMI 22.71 kg/m  Physical Exam Vitals and nursing note reviewed.  Constitutional:      General: She is not in acute distress.    Appearance: Normal appearance. She is well-developed.  HENT:     Head: Normocephalic and atraumatic.  Eyes:     Conjunctiva/sclera: Conjunctivae normal.     Pupils: Pupils are equal, round, and reactive to light.  Cardiovascular:     Rate and Rhythm: Normal rate and regular rhythm.     Heart sounds: Normal heart sounds.  Pulmonary:     Effort: Pulmonary effort is normal. No respiratory distress.     Breath sounds: Normal breath sounds.  Chest:     Comments:  Mild tenderness elicited with palpation of the left anterior chest consistent with muscular strain.  Patient's pain is reproduced entirely with palpation. Abdominal:     General: There is no distension.     Palpations: Abdomen is soft.     Tenderness: There is no abdominal tenderness.  Musculoskeletal:        General: No deformity. Normal range of motion.     Cervical back: Normal range of motion and neck supple.  Skin:    General: Skin is warm and dry.  Neurological:     General: No focal deficit present.     Mental Status: She is alert and oriented to person, place, and time.     ED Results / Procedures / Treatments   Labs (all labs ordered are listed, but only abnormal results are displayed) Labs Reviewed  BASIC METABOLIC PANEL - Abnormal; Notable for the following components:      Result Value   Potassium 3.4 (*)    Glucose, Bld 59 (*)    All other components within normal limits  CBC  TROPONIN I (HIGH SENSITIVITY)    EKG EKG Interpretation  Date/Time:  Wednesday September 08 2022 18:10:40 EST Ventricular Rate:  71 PR Interval:  153 QRS Duration: 89 QT Interval:  389 QTC Calculation: 423  R Axis:   47 Text Interpretation: Sinus rhythm Confirmed by Dene Gentry [phone]) on 09/08/2022 7:54:57 PM  Radiology DG Chest 2 View  Result Date: 09/08/2022 CLINICAL DATA:  Chest pain EXAM: CHEST - 2 VIEW COMPARISON:  None Available. FINDINGS: Cardiac silhouette and mediastinal contours are within normal limits. The lungs are clear. No pleural effusion or pneumothorax. Normal regional bones. IMPRESSION: No active cardiopulmonary disease. Electronically Signed   By: Yvonne Kendall M.D.   On: 09/08/2022 19:24    Procedures Procedures    Medications Ordered in ED Medications  ibuprofen (ADVIL) tablet 600 mg (600 mg Oral Given 09/08/22 2027)    ED Course/ Medical Decision Making/ A&P                             Medical Decision Making Risk OTC drugs.    Medical Screen  Complete  This patient presented to the ED with complaint of URI symptoms, atypical chest pain.  This complaint involves an extensive number of treatment options. The initial differential diagnosis includes, but is not limited to, URI, atypical chest pain  This presentation is: Acute, Self-Limited, Previously Undiagnosed, Uncertain Prognosis, Complicated, and Systemic Symptoms  Otherwise healthy young female presents with URI symptoms and atypical chest pain.  Describe symptoms are not consistent with likely ACS.    EKG is without evidence of ischemia.  Troponin is undetectable.  Patient's describe symptoms are most consistent with likely muscular strain in the setting of URI.  Unfortunately, as patient was about to be discharged she and her companion left the ED just prior to discharge.  She has capacity to refuse care or leave [place].  Additional history obtained:  External records from outside sources obtained and reviewed including prior ED visits and prior Inpatient records.    Lab Tests:  I ordered and personally interpreted labs.  The pertinent results include: CBC, BMP, troponin   Imaging Studies ordered:  I ordered imaging studies including chest x-ray I independently visualized and interpreted obtained imaging which showed NAD I agree with the radiologist interpretation.   Cardiac Monitoring:  The patient was maintained on a cardiac monitor.  I personally viewed and interpreted the cardiac monitor which showed an underlying rhythm of: NSR   Medicines ordered:  I ordered medication including ibuprofen for pain Reevaluation of the patient after these medicines showed that the patient: improved  Problem List / ED Course:  URI, atypical chest pain   Reevaluation:  After the interventions noted above, I reevaluated the patient and found that they have: improved  Disposition:  After consideration of the diagnostic results and the patients  response to treatment, I feel that the patent would benefit from close outpatient follow-up.          Final Clinical Impression(s) / ED Diagnoses Final diagnoses:  Chest wall pain    Rx / DC Orders ED Discharge Orders     None         Valarie Merino, MD 09/08/22 2136

## 2022-12-26 ENCOUNTER — Encounter (HOSPITAL_COMMUNITY): Payer: Self-pay

## 2022-12-26 ENCOUNTER — Ambulatory Visit (HOSPITAL_COMMUNITY)
Admission: RE | Admit: 2022-12-26 | Discharge: 2022-12-26 | Disposition: A | Discharge status: Home / Self Care | Payer: Medicaid Other | Source: Ambulatory Visit | Attending: Emergency Medicine | Admitting: Emergency Medicine

## 2022-12-26 VITALS — BP 124/88 | HR 82 | Temp 98.2°F | Resp 15

## 2022-12-26 DIAGNOSIS — Z23 Encounter for immunization: Secondary | ICD-10-CM | POA: Diagnosis not present

## 2022-12-26 DIAGNOSIS — S0081XA Abrasion of other part of head, initial encounter: Secondary | ICD-10-CM

## 2022-12-26 DIAGNOSIS — M7918 Myalgia, other site: Secondary | ICD-10-CM | POA: Diagnosis not present

## 2022-12-26 MED ORDER — NAPROXEN 500 MG PO TABS: 500.0000 mg | ORAL_TABLET | Freq: Two times a day (BID) | ORAL | 0 refills | Status: AC

## 2022-12-26 MED ORDER — MUPIROCIN CALCIUM 2 % EX CREA: 1.0000 | TOPICAL_CREAM | Freq: Two times a day (BID) | CUTANEOUS | 0 refills | Status: AC

## 2022-12-26 MED ORDER — TETANUS-DIPHTH-ACELL PERTUSSIS 5-2.5-18.5 LF-MCG/0.5 IM SUSY
0.5000 mL | PREFILLED_SYRINGE | Freq: Once | INTRAMUSCULAR | Status: AC
  Administered 2022-12-26: 0.5 mL via INTRAMUSCULAR

## 2022-12-26 MED ORDER — TETANUS-DIPHTH-ACELL PERTUSSIS 5-2.5-18.5 LF-MCG/0.5 IM SUSY
PREFILLED_SYRINGE | INTRAMUSCULAR | Status: AC
  Filled 2022-12-26: qty 0.5

## 2022-12-26 MED ORDER — KETOROLAC TROMETHAMINE 30 MG/ML IJ SOLN
30.0000 mg | Freq: Once | INTRAMUSCULAR | Status: AC
  Administered 2022-12-26: 30 mg via INTRAMUSCULAR

## 2022-12-26 MED ORDER — METHOCARBAMOL 500 MG PO TABS: 500.0000 mg | ORAL_TABLET | Freq: Two times a day (BID) | ORAL | 0 refills | Status: AC

## 2022-12-26 MED ORDER — KETOROLAC TROMETHAMINE 30 MG/ML IJ SOLN
INTRAMUSCULAR | Status: AC
  Filled 2022-12-26: qty 1

## 2022-12-26 MED ORDER — CHLORHEXIDINE GLUCONATE 4 % EX SOLN: Freq: Every day | CUTANEOUS | 0 refills | Status: AC | PRN
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
## 2022-12-26 NOTE — Discharge Instructions (Addendum)
Please keep your wound clean and dry to your forehead.  You can do a warm compress with the Hibiclens solution, pat dry and apply the antibacterial ointment.  For your musculoskeletal pain, please take the anti-inflammatories as scheduled. Start the naproxen tomorrow, as we gave you a Toradol injection today, and these are the same class of medication. You can also take muscle relaxers up to twice daily, do not drink or drive on these medications as they may make you drowsy.  If your musculoskeletal pain persist beyond the next week or so, you can follow-up with [facility], due to your insurance you will have to go to your primary care to obtain this referral.  Please return to clinic or seek immediate care if you develop syncope, one-sided weakness, trouble ambulating, incontinence, and or leg numbness, or any new concerning symptoms.
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
